# Patient Record
Sex: Male | Born: 1935 | Hispanic: No | Marital: Single | Smoking: Former smoker
Health system: Southern US, Community
[De-identification: ages and names within clinical notes are randomized; demographics above are authoritative.]

## PROBLEM LIST (undated history)

## (undated) DIAGNOSIS — R972 Elevated prostate specific antigen [PSA]: Secondary | ICD-10-CM

## (undated) DIAGNOSIS — I739 Peripheral vascular disease, unspecified: Secondary | ICD-10-CM

## (undated) DIAGNOSIS — M25569 Pain in unspecified knee: Secondary | ICD-10-CM

## (undated) DIAGNOSIS — K219 Gastro-esophageal reflux disease without esophagitis: Secondary | ICD-10-CM

## (undated) DIAGNOSIS — M1990 Unspecified osteoarthritis, unspecified site: Secondary | ICD-10-CM

## (undated) DIAGNOSIS — E785 Hyperlipidemia, unspecified: Secondary | ICD-10-CM

## (undated) HISTORY — DX: Gastro-esophageal reflux disease without esophagitis: K21.9

## (undated) HISTORY — PX: FOOT FRACTURE SURGERY: SHX645

## (undated) HISTORY — DX: Pain in unspecified knee: M25.569

## (undated) HISTORY — PX: HERNIA REPAIR: SHX51

## (undated) HISTORY — PX: OTHER SURGICAL HISTORY: SHX169

## (undated) HISTORY — DX: Hyperlipidemia, unspecified: E78.5

## (undated) HISTORY — DX: Unspecified osteoarthritis, unspecified site: M19.90

## (undated) HISTORY — DX: Peripheral vascular disease, unspecified: I73.9

## (undated) HISTORY — DX: Elevated prostate specific antigen (PSA): R97.20

---

## 1989-07-31 HISTORY — PX: OTHER SURGICAL HISTORY: SHX169

## 1997-06-30 HISTORY — PX: VAGOTOMY: SHX5282

## 2004-06-22 ENCOUNTER — Ambulatory Visit: Payer: Self-pay | Admitting: Internal Medicine

## 2004-06-30 ENCOUNTER — Ambulatory Visit: Payer: Self-pay

## 2004-08-22 ENCOUNTER — Ambulatory Visit: Payer: Self-pay | Admitting: Internal Medicine

## 2004-08-23 ENCOUNTER — Ambulatory Visit: Payer: Self-pay | Admitting: Internal Medicine

## 2004-10-20 ENCOUNTER — Ambulatory Visit: Payer: Self-pay | Admitting: Internal Medicine

## 2005-03-06 ENCOUNTER — Ambulatory Visit: Payer: Self-pay | Admitting: Internal Medicine

## 2005-03-13 ENCOUNTER — Ambulatory Visit: Payer: Self-pay | Admitting: Internal Medicine

## 2005-06-06 ENCOUNTER — Ambulatory Visit: Payer: Self-pay | Admitting: Internal Medicine

## 2005-06-13 ENCOUNTER — Ambulatory Visit: Payer: Self-pay | Admitting: Internal Medicine

## 2005-09-11 ENCOUNTER — Ambulatory Visit: Payer: Self-pay | Admitting: Internal Medicine

## 2005-09-19 ENCOUNTER — Ambulatory Visit: Payer: Self-pay | Admitting: Internal Medicine

## 2005-11-09 ENCOUNTER — Emergency Department (HOSPITAL_COMMUNITY): Admission: EM | Admit: 2005-11-09 | Discharge: 2005-11-09 | Payer: Self-pay | Admitting: Emergency Medicine

## 2005-11-20 ENCOUNTER — Ambulatory Visit: Payer: Self-pay | Admitting: Internal Medicine

## 2005-11-27 ENCOUNTER — Ambulatory Visit: Payer: Self-pay | Admitting: Internal Medicine

## 2006-02-06 ENCOUNTER — Ambulatory Visit: Payer: Self-pay | Admitting: Internal Medicine

## 2006-02-20 ENCOUNTER — Ambulatory Visit: Payer: Self-pay | Admitting: Internal Medicine

## 2006-05-21 ENCOUNTER — Ambulatory Visit: Payer: Self-pay | Admitting: Internal Medicine

## 2006-05-21 LAB — CONVERTED CEMR LAB
ALT: 26 units/L (ref 0–40)
AST: 26 units/L (ref 0–37)
Albumin: 3.5 g/dL (ref 3.5–5.2)
Alkaline Phosphatase: 53 units/L (ref 39–117)
BUN: 18 mg/dL (ref 6–23)
Basophils Absolute: 0 10*3/uL (ref 0.0–0.1)
Basophils Relative: 0.4 % (ref 0.0–1.0)
Bilirubin Urine: NEGATIVE
CO2: 31 meq/L (ref 19–32)
Calcium: 9.3 mg/dL (ref 8.4–10.5)
Chloride: 106 meq/L (ref 96–112)
Chol/HDL Ratio, serum: 4.2
Cholesterol: 248 mg/dL (ref 0–200)
Creatinine, Ser: 1 mg/dL (ref 0.4–1.5)
Eosinophil percent: 1.1 % (ref 0.0–5.0)
GFR calc non Af Amer: 79 mL/min
Glomerular Filtration Rate, Af Am: 95 mL/min/{1.73_m2}
Glucose, Bld: 92 mg/dL (ref 70–99)
HCT: 44.1 % (ref 39.0–52.0)
HDL: 58.4 mg/dL (ref >39.0)
Hemoglobin, Urine: NEGATIVE
Hemoglobin: 14.5 g/dL (ref 13.0–17.0)
Ketones, ur: NEGATIVE mg/dL
LDL DIRECT: 168.9 mg/dL
Leukocytes, UA: NEGATIVE
Lymphocytes Relative: 22.8 % (ref 12.0–46.0)
MCHC: 32.9 g/dL (ref 30.0–36.0)
MCV: 92.8 fL (ref 78.0–100.0)
Monocytes Absolute: 0.5 10*3/uL (ref 0.2–0.7)
Monocytes Relative: 9.2 % (ref 3.0–11.0)
Neutro Abs: 4 10*3/uL (ref 1.4–7.7)
Neutrophils Relative %: 66.5 % (ref 43.0–77.0)
Nitrite: NEGATIVE
PSA: 5.01 ng/mL — ABNORMAL HIGH (ref 0.10–4.00)
Platelets: 172 10*3/uL (ref 150–400)
Potassium: 4.3 meq/L (ref 3.5–5.1)
RBC: 4.75 M/uL (ref 4.22–5.81)
RDW: 14.4 % (ref 11.5–14.6)
Sodium: 141 meq/L (ref 135–145)
Specific Gravity, Urine: 1.015 (ref 1.000–1.03)
TSH: 1.67 microintl units/mL (ref 0.35–5.50)
Total Bilirubin: 0.7 mg/dL (ref 0.3–1.2)
Total Protein, Urine: NEGATIVE mg/dL
Total Protein: 6.7 g/dL (ref 6.0–8.3)
Triglyceride fasting, serum: 69 mg/dL (ref 0–149)
Urine Glucose: NEGATIVE mg/dL
Urobilinogen, UA: 0.2 (ref 0.0–1.0)
VLDL: 14 mg/dL (ref 0–40)
WBC: 5.9 10*3/uL (ref 4.5–10.5)
pH: 6 (ref 5.0–8.0)

## 2006-05-29 ENCOUNTER — Ambulatory Visit: Payer: Self-pay | Admitting: Internal Medicine

## 2006-07-31 DIAGNOSIS — R972 Elevated prostate specific antigen [PSA]: Secondary | ICD-10-CM

## 2006-07-31 HISTORY — DX: Elevated prostate specific antigen (PSA): R97.20

## 2006-09-11 ENCOUNTER — Ambulatory Visit: Payer: Self-pay | Admitting: Internal Medicine

## 2006-09-11 LAB — CONVERTED CEMR LAB
ALT: 35 units/L (ref 0–40)
AST: 28 units/L (ref 0–37)
Cholesterol: 260 mg/dL (ref 0–200)
Direct LDL: 171.2 mg/dL
HDL: 59 mg/dL (ref >39.0)
PSA: 5.95 ng/mL — ABNORMAL HIGH (ref 0.10–4.00)
Sed Rate: 20 mm/hr (ref 0–20)
Total CHOL/HDL Ratio: 4.4
Triglycerides: 82 mg/dL (ref 0–149)
VLDL: 16 mg/dL (ref 0–40)

## 2006-09-18 ENCOUNTER — Ambulatory Visit: Payer: Self-pay | Admitting: Internal Medicine

## 2006-12-11 ENCOUNTER — Ambulatory Visit: Payer: Self-pay | Admitting: Internal Medicine

## 2006-12-11 LAB — CONVERTED CEMR LAB
ALT: 30 units/L (ref 0–40)
AST: 31 units/L (ref 0–37)
Cholesterol: 222 mg/dL (ref 0–200)
Direct LDL: 145.6 mg/dL
Glucose, Bld: 86 mg/dL (ref 70–99)
HDL: 52.1 mg/dL (ref >39.0)
PSA: 5.53 ng/mL — ABNORMAL HIGH (ref 0.10–4.00)
Total CHOL/HDL Ratio: 4.3
Triglycerides: 52 mg/dL (ref 0–149)
VLDL: 10 mg/dL (ref 0–40)

## 2006-12-12 ENCOUNTER — Encounter: Payer: Self-pay | Admitting: Internal Medicine

## 2006-12-12 LAB — CONVERTED CEMR LAB
PSA, Free Pct: 20 — ABNORMAL LOW (ref >25)
PSA, Free: 1.2 ng/mL
PSA: 5.89 ng/mL — ABNORMAL HIGH (ref 0.10–4.00)

## 2006-12-19 ENCOUNTER — Ambulatory Visit: Payer: Self-pay | Admitting: Internal Medicine

## 2007-02-23 ENCOUNTER — Encounter: Payer: Self-pay | Admitting: Internal Medicine

## 2007-02-23 DIAGNOSIS — R972 Elevated prostate specific antigen [PSA]: Secondary | ICD-10-CM | POA: Insufficient documentation

## 2007-02-23 DIAGNOSIS — I872 Venous insufficiency (chronic) (peripheral): Secondary | ICD-10-CM | POA: Insufficient documentation

## 2007-02-23 DIAGNOSIS — M1990 Unspecified osteoarthritis, unspecified site: Secondary | ICD-10-CM | POA: Insufficient documentation

## 2007-02-23 DIAGNOSIS — M4120 Other idiopathic scoliosis, site unspecified: Secondary | ICD-10-CM | POA: Insufficient documentation

## 2007-02-23 DIAGNOSIS — K649 Unspecified hemorrhoids: Secondary | ICD-10-CM | POA: Insufficient documentation

## 2007-04-02 ENCOUNTER — Ambulatory Visit: Payer: Self-pay | Admitting: Internal Medicine

## 2007-04-02 LAB — CONVERTED CEMR LAB
ALT: 33 units/L (ref 0–53)
AST: 35 units/L (ref 0–37)
Cholesterol: 218 mg/dL (ref 0–200)
Direct LDL: 145.3 mg/dL
HDL: 55.6 mg/dL (ref >39.0)
PSA: 5.81 ng/mL — ABNORMAL HIGH (ref 0.10–4.00)
Total CHOL/HDL Ratio: 3.9
Triglycerides: 73 mg/dL (ref 0–149)
VLDL: 15 mg/dL (ref 0–40)
Vit D, 1,25-Dihydroxy: 30 (ref 20–57)

## 2007-04-10 ENCOUNTER — Ambulatory Visit: Payer: Self-pay | Admitting: Internal Medicine

## 2007-06-25 ENCOUNTER — Encounter: Payer: Self-pay | Admitting: Internal Medicine

## 2007-07-10 ENCOUNTER — Ambulatory Visit: Payer: Self-pay | Admitting: Internal Medicine

## 2007-07-10 DIAGNOSIS — E785 Hyperlipidemia, unspecified: Secondary | ICD-10-CM | POA: Insufficient documentation

## 2007-07-10 LAB — CONVERTED CEMR LAB
ALT: 29 units/L (ref 0–53)
AST: 31 units/L (ref 0–37)
Albumin: 3.2 g/dL — ABNORMAL LOW (ref 3.5–5.2)
Alkaline Phosphatase: 47 units/L (ref 39–117)
BUN: 19 mg/dL (ref 6–23)
Bilirubin, Direct: 0.1 mg/dL (ref 0.0–0.3)
CO2: 31 meq/L (ref 19–32)
Calcium: 9.5 mg/dL (ref 8.4–10.5)
Chloride: 108 meq/L (ref 96–112)
Creatinine, Ser: 1.1 mg/dL (ref 0.4–1.5)
GFR calc Af Amer: 85 mL/min
GFR calc non Af Amer: 70 mL/min
Glucose, Bld: 103 mg/dL — ABNORMAL HIGH (ref 70–99)
PSA: 5.24 ng/mL — ABNORMAL HIGH (ref 0.10–4.00)
Potassium: 4.7 meq/L (ref 3.5–5.1)
Sodium: 144 meq/L (ref 135–145)
TSH: 2.15 microintl units/mL (ref 0.35–5.50)
Total Bilirubin: 1.1 mg/dL (ref 0.3–1.2)
Total Protein: 6.6 g/dL (ref 6.0–8.3)

## 2007-07-14 ENCOUNTER — Encounter: Payer: Self-pay | Admitting: Internal Medicine

## 2007-10-17 ENCOUNTER — Ambulatory Visit: Payer: Self-pay | Admitting: Internal Medicine

## 2007-10-20 LAB — CONVERTED CEMR LAB
ALT: 31 units/L (ref 0–53)
AST: 31 units/L (ref 0–37)
Cholesterol: 233 mg/dL (ref 0–200)
Direct LDL: 153.2 mg/dL
HDL: 59.4 mg/dL (ref >39.0)
PSA: 5.55 ng/mL — ABNORMAL HIGH (ref 0.10–4.00)
Total CHOL/HDL Ratio: 3.9
Triglycerides: 74 mg/dL (ref 0–149)
VLDL: 15 mg/dL (ref 0–40)

## 2007-10-24 ENCOUNTER — Ambulatory Visit: Payer: Self-pay | Admitting: Internal Medicine

## 2007-10-31 ENCOUNTER — Encounter: Payer: Self-pay | Admitting: Internal Medicine

## 2007-12-06 ENCOUNTER — Encounter: Payer: Self-pay | Admitting: Internal Medicine

## 2007-12-31 ENCOUNTER — Ambulatory Visit: Payer: Self-pay | Admitting: Internal Medicine

## 2007-12-31 DIAGNOSIS — R21 Rash and other nonspecific skin eruption: Secondary | ICD-10-CM

## 2008-01-01 ENCOUNTER — Telehealth: Payer: Self-pay | Admitting: Internal Medicine

## 2008-01-01 ENCOUNTER — Encounter: Payer: Self-pay | Admitting: Internal Medicine

## 2008-01-20 ENCOUNTER — Encounter: Payer: Self-pay | Admitting: Internal Medicine

## 2008-02-12 ENCOUNTER — Ambulatory Visit: Payer: Self-pay | Admitting: Internal Medicine

## 2008-02-12 LAB — CONVERTED CEMR LAB
ALT: 37 units/L (ref 0–53)
AST: 40 units/L — ABNORMAL HIGH (ref 0–37)
Albumin: 3.6 g/dL (ref 3.5–5.2)
Alkaline Phosphatase: 54 units/L (ref 39–117)
BUN: 20 mg/dL (ref 6–23)
Bilirubin, Direct: 0.1 mg/dL (ref 0.0–0.3)
CO2: 32 meq/L (ref 19–32)
Calcium: 9.5 mg/dL (ref 8.4–10.5)
Chloride: 107 meq/L (ref 96–112)
Cholesterol: 186 mg/dL (ref 0–200)
Creatinine, Ser: 0.9 mg/dL (ref 0.4–1.5)
GFR calc Af Amer: 107 mL/min
GFR calc non Af Amer: 88 mL/min
Glucose, Bld: 98 mg/dL (ref 70–99)
HDL: 55 mg/dL (ref >39.0)
Hgb A1c MFr Bld: 5.8 % (ref 4.6–6.0)
LDL Cholesterol: 118 mg/dL — ABNORMAL HIGH (ref 0–99)
Potassium: 4.5 meq/L (ref 3.5–5.1)
Sodium: 142 meq/L (ref 135–145)
Total Bilirubin: 1 mg/dL (ref 0.3–1.2)
Total CHOL/HDL Ratio: 3.4
Total Protein: 6.7 g/dL (ref 6.0–8.3)
Triglycerides: 65 mg/dL (ref 0–149)
VLDL: 13 mg/dL (ref 0–40)

## 2008-02-19 ENCOUNTER — Ambulatory Visit: Payer: Self-pay | Admitting: Internal Medicine

## 2008-03-30 ENCOUNTER — Encounter: Payer: Self-pay | Admitting: Internal Medicine

## 2008-03-30 ENCOUNTER — Telehealth: Payer: Self-pay | Admitting: Internal Medicine

## 2008-04-27 ENCOUNTER — Encounter: Payer: Self-pay | Admitting: Internal Medicine

## 2008-06-24 ENCOUNTER — Ambulatory Visit: Payer: Self-pay | Admitting: Internal Medicine

## 2008-06-24 LAB — CONVERTED CEMR LAB
ALT: 30 units/L (ref 0–53)
AST: 34 units/L (ref 0–37)
Albumin: 3.8 g/dL (ref 3.5–5.2)
Alkaline Phosphatase: 55 units/L (ref 39–117)
BUN: 23 mg/dL (ref 6–23)
Bilirubin, Direct: 0.2 mg/dL (ref 0.0–0.3)
CO2: 30 meq/L (ref 19–32)
Calcium: 9.4 mg/dL (ref 8.4–10.5)
Chloride: 107 meq/L (ref 96–112)
Cholesterol: 208 mg/dL (ref 0–200)
Creatinine, Ser: 0.9 mg/dL (ref 0.4–1.5)
Direct LDL: 110.6 mg/dL
GFR calc Af Amer: 107 mL/min
GFR calc non Af Amer: 88 mL/min
Glucose, Bld: 93 mg/dL (ref 70–99)
HDL: 68 mg/dL (ref >39.0)
PSA: 6.86 ng/mL — ABNORMAL HIGH (ref 0.10–4.00)
Potassium: 4.2 meq/L (ref 3.5–5.1)
Sodium: 142 meq/L (ref 135–145)
TSH: 1.3 microintl units/mL (ref 0.35–5.50)
Total Bilirubin: 1.1 mg/dL (ref 0.3–1.2)
Total CHOL/HDL Ratio: 3.1
Total Protein: 7.3 g/dL (ref 6.0–8.3)
Triglycerides: 62 mg/dL (ref 0–149)
VLDL: 12 mg/dL (ref 0–40)

## 2008-07-31 HISTORY — PX: OTHER SURGICAL HISTORY: SHX169

## 2008-10-22 ENCOUNTER — Ambulatory Visit: Payer: Self-pay | Admitting: Internal Medicine

## 2008-10-22 LAB — CONVERTED CEMR LAB
ALT: 25 units/L (ref 0–53)
AST: 29 units/L (ref 0–37)
Albumin: 3.7 g/dL (ref 3.5–5.2)
Alkaline Phosphatase: 53 units/L (ref 39–117)
BUN: 26 mg/dL — ABNORMAL HIGH (ref 6–23)
Bilirubin, Direct: 0.1 mg/dL (ref 0.0–0.3)
CO2: 28 meq/L (ref 19–32)
Calcium: 9.5 mg/dL (ref 8.4–10.5)
Chloride: 105 meq/L (ref 96–112)
Cholesterol: 198 mg/dL (ref 0–200)
Creatinine, Ser: 0.9 mg/dL (ref 0.4–1.5)
GFR calc non Af Amer: 87.97 mL/min (ref >60)
Glucose, Bld: 94 mg/dL (ref 70–99)
HDL: 61.9 mg/dL (ref >39.00)
LDL Cholesterol: 122 mg/dL — ABNORMAL HIGH (ref 0–99)
PSA: 5.84 ng/mL — ABNORMAL HIGH (ref 0.10–4.00)
Potassium: 4.1 meq/L (ref 3.5–5.1)
Sodium: 139 meq/L (ref 135–145)
Total Bilirubin: 0.9 mg/dL (ref 0.3–1.2)
Total CHOL/HDL Ratio: 3
Total Protein: 6.8 g/dL (ref 6.0–8.3)
Triglycerides: 71 mg/dL (ref 0.0–149.0)
VLDL: 14.2 mg/dL (ref 0.0–40.0)

## 2008-11-24 ENCOUNTER — Telehealth: Payer: Self-pay | Admitting: Internal Medicine

## 2009-02-19 ENCOUNTER — Ambulatory Visit: Payer: Self-pay | Admitting: Internal Medicine

## 2009-02-19 DIAGNOSIS — M25569 Pain in unspecified knee: Secondary | ICD-10-CM

## 2009-02-19 LAB — CONVERTED CEMR LAB
ALT: 26 units/L (ref 0–53)
AST: 35 units/L (ref 0–37)
Albumin: 3.6 g/dL (ref 3.5–5.2)
Alkaline Phosphatase: 54 units/L (ref 39–117)
BUN: 23 mg/dL (ref 6–23)
Basophils Absolute: 0 10*3/uL (ref 0.0–0.1)
Basophils Relative: 0.3 % (ref 0.0–3.0)
Bilirubin, Direct: 0.2 mg/dL (ref 0.0–0.3)
CO2: 30 meq/L (ref 19–32)
Calcium: 9.2 mg/dL (ref 8.4–10.5)
Chloride: 107 meq/L (ref 96–112)
Cholesterol: 218 mg/dL — ABNORMAL HIGH (ref 0–200)
Creatinine, Ser: 0.9 mg/dL (ref 0.4–1.5)
Direct LDL: 131.8 mg/dL
Eosinophils Absolute: 0.1 10*3/uL (ref 0.0–0.7)
Eosinophils Relative: 1 % (ref 0.0–5.0)
GFR calc non Af Amer: 87.89 mL/min (ref >60)
Glucose, Bld: 88 mg/dL (ref 70–99)
HCT: 39.6 % (ref 39.0–52.0)
HDL: 65.4 mg/dL (ref >39.00)
Hemoglobin: 13.6 g/dL (ref 13.0–17.0)
Lymphocytes Relative: 19.2 % (ref 12.0–46.0)
Lymphs Abs: 1.1 10*3/uL (ref 0.7–4.0)
MCHC: 34.3 g/dL (ref 30.0–36.0)
MCV: 94.2 fL (ref 78.0–100.0)
Monocytes Absolute: 0.5 10*3/uL (ref 0.1–1.0)
Monocytes Relative: 9.3 % (ref 3.0–12.0)
Neutro Abs: 3.8 10*3/uL (ref 1.4–7.7)
Neutrophils Relative %: 70.2 % (ref 43.0–77.0)
Platelets: 131 10*3/uL — ABNORMAL LOW (ref 150.0–400.0)
Potassium: 4.3 meq/L (ref 3.5–5.1)
RBC: 4.2 M/uL — ABNORMAL LOW (ref 4.22–5.81)
RDW: 12.9 % (ref 11.5–14.6)
Sodium: 140 meq/L (ref 135–145)
TSH: 1.56 microintl units/mL (ref 0.35–5.50)
Total Bilirubin: 1.2 mg/dL (ref 0.3–1.2)
Total CHOL/HDL Ratio: 3
Total Protein: 6.8 g/dL (ref 6.0–8.3)
Triglycerides: 52 mg/dL (ref 0.0–149.0)
VLDL: 10.4 mg/dL (ref 0.0–40.0)
WBC: 5.5 10*3/uL (ref 4.5–10.5)

## 2009-02-22 ENCOUNTER — Encounter: Payer: Self-pay | Admitting: Internal Medicine

## 2009-02-25 LAB — CONVERTED CEMR LAB: PSA: 6.54 ng/mL — ABNORMAL HIGH (ref 0.10–4.00)

## 2009-06-23 ENCOUNTER — Ambulatory Visit: Payer: Self-pay | Admitting: Internal Medicine

## 2009-06-23 DIAGNOSIS — Z87891 Personal history of nicotine dependence: Secondary | ICD-10-CM

## 2009-06-23 LAB — CONVERTED CEMR LAB
ALT: 26 units/L (ref 0–53)
Basophils Absolute: 0 10*3/uL (ref 0.0–0.1)
CO2: 30 meq/L (ref 19–32)
Calcium: 9.4 mg/dL (ref 8.4–10.5)
Chloride: 106 meq/L (ref 96–112)
Cholesterol: 212 mg/dL — ABNORMAL HIGH (ref 0–200)
Creatinine, Ser: 0.9 mg/dL (ref 0.4–1.5)
Eosinophils Relative: 0.9 % (ref 0.0–5.0)
GFR calc non Af Amer: 87.81 mL/min (ref >60)
Glucose, Bld: 85 mg/dL (ref 70–99)
HCT: 39.9 % (ref 39.0–52.0)
Hemoglobin: 13.7 g/dL (ref 13.0–17.0)
Lymphocytes Relative: 19.7 % (ref 12.0–46.0)
Monocytes Relative: 9.2 % (ref 3.0–12.0)
Neutro Abs: 4.3 10*3/uL (ref 1.4–7.7)
RBC: 4.16 M/uL — ABNORMAL LOW (ref 4.22–5.81)
RDW: 12.6 % (ref 11.5–14.6)
Sodium: 140 meq/L (ref 135–145)
TSH: 1.67 microintl units/mL (ref 0.35–5.50)
Total CHOL/HDL Ratio: 3
Total Protein: 6.7 g/dL (ref 6.0–8.3)
VLDL: 13.6 mg/dL (ref 0.0–40.0)
WBC: 6.2 10*3/uL (ref 4.5–10.5)

## 2009-10-20 ENCOUNTER — Ambulatory Visit: Payer: Self-pay | Admitting: Internal Medicine

## 2010-01-06 ENCOUNTER — Encounter: Payer: Self-pay | Admitting: Internal Medicine

## 2010-02-16 ENCOUNTER — Telehealth: Payer: Self-pay | Admitting: Internal Medicine

## 2010-02-25 ENCOUNTER — Ambulatory Visit: Payer: Self-pay | Admitting: Internal Medicine

## 2010-02-25 DIAGNOSIS — R209 Unspecified disturbances of skin sensation: Secondary | ICD-10-CM

## 2010-02-25 DIAGNOSIS — R238 Other skin changes: Secondary | ICD-10-CM

## 2010-02-25 LAB — CONVERTED CEMR LAB
Albumin: 4 g/dL (ref 3.5–5.2)
BUN: 15 mg/dL (ref 6–23)
Calcium: 9.2 mg/dL (ref 8.4–10.5)
Cholesterol: 181 mg/dL (ref 0–200)
GFR calc non Af Amer: 115.23 mL/min (ref >60)
Glucose, Bld: 74 mg/dL (ref 70–99)
HDL: 75.8 mg/dL (ref >39.00)
Triglycerides: 65 mg/dL (ref 0.0–149.0)
VLDL: 13 mg/dL (ref 0.0–40.0)

## 2010-03-24 ENCOUNTER — Telehealth: Payer: Self-pay | Admitting: Internal Medicine

## 2010-04-07 ENCOUNTER — Encounter: Payer: Self-pay | Admitting: Internal Medicine

## 2010-05-27 ENCOUNTER — Ambulatory Visit: Payer: Self-pay | Admitting: Internal Medicine

## 2010-05-27 DIAGNOSIS — R609 Edema, unspecified: Secondary | ICD-10-CM | POA: Insufficient documentation

## 2010-05-27 LAB — CONVERTED CEMR LAB
BUN: 11 mg/dL (ref 6–23)
CO2: 30 meq/L (ref 19–32)
Chloride: 105 meq/L (ref 96–112)
Glucose, Bld: 79 mg/dL (ref 70–99)
Potassium: 5.1 meq/L (ref 3.5–5.1)

## 2010-06-02 ENCOUNTER — Telehealth: Payer: Self-pay | Admitting: Internal Medicine

## 2010-06-09 ENCOUNTER — Encounter: Payer: Self-pay | Admitting: Internal Medicine

## 2010-06-22 ENCOUNTER — Encounter: Payer: Self-pay | Admitting: Internal Medicine

## 2010-07-31 HISTORY — PX: JOINT REPLACEMENT: SHX530

## 2010-08-12 ENCOUNTER — Ambulatory Visit
Admission: RE | Admit: 2010-08-12 | Discharge: 2010-08-12 | Payer: Self-pay | Source: Home / Self Care | Attending: Internal Medicine | Admitting: Internal Medicine

## 2010-08-12 DIAGNOSIS — L290 Pruritus ani: Secondary | ICD-10-CM | POA: Insufficient documentation

## 2010-08-17 LAB — COMPREHENSIVE METABOLIC PANEL
ALT: 35 U/L (ref 0–53)
AST: 41 U/L — ABNORMAL HIGH (ref 0–37)
Albumin: 3.7 g/dL (ref 3.5–5.2)
Alkaline Phosphatase: 63 U/L (ref 39–117)
BUN: 8 mg/dL (ref 6–23)
CO2: 28 mEq/L (ref 19–32)
Calcium: 9.5 mg/dL (ref 8.4–10.5)
Chloride: 104 mEq/L (ref 96–112)
Creatinine, Ser: 0.98 mg/dL (ref 0.4–1.5)
GFR calc Af Amer: >60 mL/min (ref >60)
GFR calc non Af Amer: >60 mL/min (ref >60)
Glucose, Bld: 90 mg/dL (ref 70–99)
Potassium: 4.3 mEq/L (ref 3.5–5.1)
Sodium: 139 mEq/L (ref 135–145)
Total Bilirubin: 0.7 mg/dL (ref 0.3–1.2)
Total Protein: 6.4 g/dL (ref 6.0–8.3)

## 2010-08-17 LAB — CBC
HCT: 38.6 % — ABNORMAL LOW (ref 39.0–52.0)
Hemoglobin: 12.9 g/dL — ABNORMAL LOW (ref 13.0–17.0)
MCH: 30.6 pg (ref 26.0–34.0)
MCHC: 33.4 g/dL (ref 30.0–36.0)
MCV: 91.5 fL (ref 78.0–100.0)
Platelets: 161 10*3/uL (ref 150–400)
RBC: 4.22 MIL/uL (ref 4.22–5.81)
RDW: 13.6 % (ref 11.5–15.5)
WBC: 6.2 10*3/uL (ref 4.0–10.5)

## 2010-08-17 LAB — PROTIME-INR
INR: 1.14 (ref 0.00–1.49)
Prothrombin Time: 14.8 seconds (ref 11.6–15.2)

## 2010-08-17 LAB — DIFFERENTIAL
Basophils Absolute: 0 10*3/uL (ref 0.0–0.1)
Basophils Relative: 0 % (ref 0–1)
Eosinophils Absolute: 0.1 10*3/uL (ref 0.0–0.7)
Eosinophils Relative: 2 % (ref 0–5)
Lymphocytes Relative: 19 % (ref 12–46)
Lymphs Abs: 1.2 10*3/uL (ref 0.7–4.0)
Monocytes Absolute: 0.6 10*3/uL (ref 0.1–1.0)
Monocytes Relative: 10 % (ref 3–12)
Neutro Abs: 4.3 10*3/uL (ref 1.7–7.7)
Neutrophils Relative %: 69 % (ref 43–77)

## 2010-08-17 LAB — SURGICAL PCR SCREEN
MRSA, PCR: NEGATIVE
Staphylococcus aureus: NEGATIVE

## 2010-08-17 LAB — URINALYSIS, ROUTINE W REFLEX MICROSCOPIC
Bilirubin Urine: NEGATIVE
Hgb urine dipstick: NEGATIVE
Ketones, ur: NEGATIVE mg/dL
Nitrite: NEGATIVE
Protein, ur: NEGATIVE mg/dL
Specific Gravity, Urine: 1.02 (ref 1.005–1.030)
Urine Glucose, Fasting: NEGATIVE mg/dL
Urobilinogen, UA: 1 mg/dL (ref 0.0–1.0)
pH: 7 (ref 5.0–8.0)

## 2010-08-17 LAB — APTT: aPTT: 45 seconds — ABNORMAL HIGH (ref 24–37)

## 2010-08-22 ENCOUNTER — Inpatient Hospital Stay (HOSPITAL_COMMUNITY)
Admission: RE | Admit: 2010-08-22 | Discharge: 2010-08-25 | Payer: Self-pay | Source: Home / Self Care | Attending: Orthopedic Surgery | Admitting: Orthopedic Surgery

## 2010-08-22 LAB — URINE CULTURE
Colony Count: 3000
Culture  Setup Time: 201201171510

## 2010-08-23 LAB — TYPE AND SCREEN
ABO/RH(D): A POS
Antibody Screen: NEGATIVE

## 2010-08-23 LAB — APTT: aPTT: 37 seconds (ref 24–37)

## 2010-08-23 LAB — ABO/RH: ABO/RH(D): A POS

## 2010-08-24 LAB — BASIC METABOLIC PANEL
BUN: 6 mg/dL (ref 6–23)
BUN: 3 mg/dL — ABNORMAL LOW (ref 6–23)
CO2: 24 mEq/L (ref 19–32)
CO2: 26 mEq/L (ref 19–32)
Calcium: 8.6 mg/dL (ref 8.4–10.5)
Calcium: 8.8 mg/dL (ref 8.4–10.5)
Chloride: 100 mEq/L (ref 96–112)
Chloride: 103 mEq/L (ref 96–112)
Creatinine, Ser: 0.92 mg/dL (ref 0.4–1.5)
Creatinine, Ser: 0.9 mg/dL (ref 0.4–1.5)
GFR calc Af Amer: >60 mL/min (ref >60)
GFR calc Af Amer: >60 mL/min (ref >60)
GFR calc non Af Amer: >60 mL/min (ref >60)
GFR calc non Af Amer: >60 mL/min (ref >60)
Glucose, Bld: 95 mg/dL (ref 70–99)
Glucose, Bld: 84 mg/dL (ref 70–99)
Potassium: 4.4 mEq/L (ref 3.5–5.1)
Potassium: 4.7 mEq/L (ref 3.5–5.1)
Sodium: 135 mEq/L (ref 135–145)
Sodium: 135 mEq/L (ref 135–145)

## 2010-08-24 LAB — CARDIAC PANEL(CRET KIN+CKTOT+MB+TROPI)
CK, MB: 3.8 ng/mL (ref 0.3–4.0)
Relative Index: 3 — ABNORMAL HIGH (ref 0.0–2.5)
Total CK: 125 U/L (ref 7–232)
Troponin I: 0.01 ng/mL (ref 0.00–0.06)

## 2010-08-24 LAB — CBC
HCT: 32 % — ABNORMAL LOW (ref 39.0–52.0)
HCT: 30.1 % — ABNORMAL LOW (ref 39.0–52.0)
Hemoglobin: 10.5 g/dL — ABNORMAL LOW (ref 13.0–17.0)
Hemoglobin: 9.9 g/dL — ABNORMAL LOW (ref 13.0–17.0)
MCH: 29.9 pg (ref 26.0–34.0)
MCH: 30.2 pg (ref 26.0–34.0)
MCHC: 32.8 g/dL (ref 30.0–36.0)
MCHC: 32.9 g/dL (ref 30.0–36.0)
MCV: 91.2 fL (ref 78.0–100.0)
MCV: 91.8 fL (ref 78.0–100.0)
Platelets: 166 10*3/uL (ref 150–400)
Platelets: 150 10*3/uL (ref 150–400)
RBC: 3.51 MIL/uL — ABNORMAL LOW (ref 4.22–5.81)
RBC: 3.28 MIL/uL — ABNORMAL LOW (ref 4.22–5.81)
RDW: 13.8 % (ref 11.5–15.5)
RDW: 13.8 % (ref 11.5–15.5)
WBC: 10.2 10*3/uL (ref 4.0–10.5)
WBC: 11.8 10*3/uL — ABNORMAL HIGH (ref 4.0–10.5)

## 2010-08-24 LAB — D-DIMER, QUANTITATIVE: D-Dimer, Quant: 3.43 ug/mL-FEU — ABNORMAL HIGH (ref 0.00–0.48)

## 2010-08-25 ENCOUNTER — Encounter: Payer: Self-pay | Admitting: Internal Medicine

## 2010-08-25 LAB — CBC
HCT: 25.2 % — ABNORMAL LOW (ref 39.0–52.0)
Hemoglobin: 8.5 g/dL — ABNORMAL LOW (ref 13.0–17.0)
MCH: 30.4 pg (ref 26.0–34.0)
MCHC: 33.7 g/dL (ref 30.0–36.0)
MCV: 90 fL (ref 78.0–100.0)
Platelets: 146 10*3/uL — ABNORMAL LOW (ref 150–400)
RBC: 2.8 MIL/uL — ABNORMAL LOW (ref 4.22–5.81)
RDW: 13.6 % (ref 11.5–15.5)
WBC: 8.3 10*3/uL (ref 4.0–10.5)

## 2010-08-25 LAB — BASIC METABOLIC PANEL
BUN: 8 mg/dL (ref 6–23)
CO2: 28 mEq/L (ref 19–32)
Calcium: 8.5 mg/dL (ref 8.4–10.5)
Chloride: 104 mEq/L (ref 96–112)
Creatinine, Ser: 1 mg/dL (ref 0.4–1.5)
GFR calc Af Amer: >60 mL/min (ref >60)
GFR calc non Af Amer: >60 mL/min (ref >60)
Glucose, Bld: 97 mg/dL (ref 70–99)
Potassium: 4 mEq/L (ref 3.5–5.1)
Sodium: 137 mEq/L (ref 135–145)

## 2010-08-31 NOTE — Progress Notes (Signed)
Summary: Ketoconazole  Phone Note Call from Patient    Follow-up for Phone Call        pt came in office today stating he needs ketoconazole 2% cream # 120 gm.  I called [facility] and spoke to pharmacist who informed me pt just p/u 90gm on 05-22-10 and paid 1.60.  She states we can send in new rx for pt's next fill. Follow-up by: Lanier Prude, [facility]),  June 02, 2010 4:02 PM    Prescriptions: KETOCONAZOLE 2 % CREA (KETOCONAZOLE) use two times a day  #120 x 3   Entered by:   Lanier Prude, [facility])   Authorized by:   Tresa Garter MD   Signed by:   Lanier Prude, [facility]) on 06/02/2010   Method used:   [facility]        [facility] [address] [phone]* (retail)       [address]       [place], [place]  [address]       Ph: [phone] or [phone]       Fax: [phone]   RxID:   [id number] KETOCONAZOLE 2 % CREA (KETOCONAZOLE) use two times a day  #120 x 3   Entered by:   Bill Salinas CMA   Authorized by:   Tresa Garter MD   Signed by:   Bill Salinas CMA on 06/02/2010   Method used:   Print then Give to Patient   RxID:   [id number]

## 2010-08-31 NOTE — Letter (Signed)
Summary: Surgical clearance/[facility]  Surgical clearance/[facility]   Imported By: Lester Sandoval 06/28/2010 11:23:34  _____________________________________________________________________  External Attachment:    Type:   Image     Comment:   External Document

## 2010-08-31 NOTE — Assessment & Plan Note (Signed)
Summary: 4 MO ROV /NWS   Vital Signs:  Patient profile:   77 year old male Height:      64 inches Weight:      163 pounds BMI:     28.08 O2 Sat:      96 % on Room air Temp:     98.1 degrees F oral Pulse rate:   66 / minute Pulse rhythm:   regular Resp:     16 per minute BP sitting:   120 / 70  (left arm) Cuff size:   regular  Vitals Entered By: Lanier Prude, CMA(AAMA) (February 25, 2010 9:56 AM)  O2 Flow:  Room air CC: 4 mo f/u Is Patient Diabetic? No   CC:  4 mo f/u.  History of Present Illness: C/o bruises C/o skin lesion on R elbow The patient presents for a follow up of  hyperlipidemia, OA and vein disease  Current Medications (verified): 1)  Ranitidine Hcl 300 Mg  Caps (Ranitidine Hcl) .Marland Kitchen.. 1 By Mouth Qd 2)  Vitamin D3 1000 Unit  Tabs (Cholecalciferol) .Marland Kitchen.. 1 Qd 3)  Crestor 40 Mg Tabs (Rosuvastatin Calcium) .Marland Kitchen.. 1 Tablet By Mouth Daily 4)  Stool Softener Plus Laxative 8.6-50 Mg  Tabs (Sennosides-Docusate Sodium) .... 2 By Mouth Bid 5)  Meloxicam 15 Mg Tabs (Meloxicam) .... One By Mouth Daily 6)  Ocuvite-Lutein  Caps (Multiple Vitamins-Minerals) .... Once Daily 7)  Tramadol Hcl 50 Mg  Tabs (Tramadol Hcl) .Marland Kitchen.. 1-2 By Mouth Two Times A Day As Needed Pain 8)  Desonide 0.05 % Crea (Desonide) .... Use Bid 9)  Ketoconazole 2 % Crea (Ketoconazole) .... Use Two Times A Day  Allergies (verified): 1)  ! Lipitor  Past History:  Past Medical History: Last updated: 02/19/2009 GERD Osteoarthritis Peripheral vascular disease Hemorrhoids Elev. PSA 2008 Hyperlipidemia XRT exposure in the 80's B knee pain L>R  Past Surgical History: Last updated: 02/23/2007 Vagotomy Nissen fundoplication modification  Social History: Last updated: 07/10/2007 Retire Former Smoker Alcohol use-yes  Regular exercise-yes Single Widower  Review of Systems  The patient denies fever, chest pain, syncope, and abdominal pain.         c/o hemorrhoids  Physical Exam  General:   Well-developed,well-nourished,in no acute distress; alert,appropriate and cooperative throughout examination Ears:  External ear exam shows no significant lesions or deformities.  Otoscopic examination reveals clear canals, tympanic membranes are intact bilaterally without bulging, retraction, inflammation or discharge. Hearing is grossly normal bilaterally. Nose:  External nasal examination shows no deformity or inflammation. Nasal mucosa are pink and moist without lesions or exudates. Mouth:  Oral mucosa and oropharynx without lesions or exudates.  Teeth in good repair. Lungs:  Normal respiratory effort, chest expands symmetrically. Lungs are clear to auscultation, no crackles or wheezes. Heart:  Normal rate and regular rhythm. S1 and S2 normal without gallop, murmur, click, rub or other extra sounds. Abdomen:  Bowel sounds positive,abdomen soft and non-tender without masses, organomegaly or hernias noted. Rectal:  declined Msk:  Thor back with a chronic scoliosis change.  Knees w/OA deformities Skin:  Hyperpigm ankles B elbows with bruises and R with 7 mm papule   Impression & Recommendations:  Problem # 1:  HYPERLIPIDEMIA (ICD-272.4) Assessment Comment Only  His updated medication list for this problem includes:    Crestor 40 Mg Tabs (Rosuvastatin calcium) .Marland Kitchen... 1 tablet by mouth daily  Orders: TLB-BMP (Basic Metabolic Panel-BMET) (80048-METABOL) TLB-Hepatic/Liver Function Pnl (80076-HEPATIC) TLB-Lipid Panel (80061-LIPID) TLB-TSH (Thyroid Stimulating Hormone) (84443-TSH) TLB-PSA (Prostate Specific Antigen) (84153-PSA)  Problem # 2:  RASH AND OTHER NONSPECIFIC SKIN ERUPTION (ICD-782.1) Assessment: Improved  His updated medication list for this problem includes:    Desonide 0.05 % Crea (Desonide) ..... Use bid    Ketoconazole 2 % Crea (Ketoconazole) ..... Use two times a day  Problem # 3:  ECCHYMOSES (ICD-782.9) Assessment: Unchanged  Orders: TLB-BMP (Basic Metabolic  Panel-BMET) (80048-METABOL) TLB-Hepatic/Liver Function Pnl (80076-HEPATIC) TLB-Lipid Panel (80061-LIPID) TLB-TSH (Thyroid Stimulating Hormone) (84443-TSH) TLB-PSA (Prostate Specific Antigen) (84153-PSA)  Problem # 4:  PSA, INCREASED (ICD-790.93) Assessment: Comment Only  Orders: TLB-BMP (Basic Metabolic Panel-BMET) (80048-METABOL) TLB-Hepatic/Liver Function Pnl (80076-HEPATIC) TLB-Lipid Panel (80061-LIPID) TLB-TSH (Thyroid Stimulating Hormone) (84443-TSH) TLB-PSA (Prostate Specific Antigen) (84153-PSA)  Problem # 5:  GERD (ICD-530.81) Assessment: Unchanged  His updated medication list for this problem includes:    Ranitidine Hcl 300 Mg Caps (Ranitidine hcl) .Marland Kitchen... 1 by mouth qd  Problem # 6:  OSTEOARTHRITIS (ICD-715.90) Assessment: Unchanged  His updated medication list for this problem includes:    Meloxicam 15 Mg Tabs (Meloxicam) ..... One by mouth daily    Tramadol Hcl 50 Mg Tabs (Tramadol hcl) .Marland Kitchen... 1-2 by mouth two times a day as needed pain  Orders: TLB-BMP (Basic Metabolic Panel-BMET) (80048-METABOL) TLB-Hepatic/Liver Function Pnl (80076-HEPATIC) TLB-Lipid Panel (80061-LIPID) TLB-TSH (Thyroid Stimulating Hormone) (84443-TSH) TLB-PSA (Prostate Specific Antigen) (84153-PSA)  Problem # 7:  HEMORRHOIDS (ICD-455.6) Assessment: Deteriorated Refused surgical consult See meds  Complete Medication List: 1)  Ranitidine Hcl 300 Mg Caps (Ranitidine hcl) .Marland Kitchen.. 1 by mouth qd 2)  Vitamin D3 1000 Unit Tabs (Cholecalciferol) .Marland Kitchen.. 1 qd 3)  Crestor 40 Mg Tabs (Rosuvastatin calcium) .Marland Kitchen.. 1 tablet by mouth daily 4)  Stool Softener Plus Laxative 8.6-50 Mg Tabs (Sennosides-docusate sodium) .... 2 by mouth bid 5)  Meloxicam 15 Mg Tabs (Meloxicam) .... One by mouth daily 6)  Ocuvite-lutein Caps (Multiple vitamins-minerals) .... Once daily 7)  Tramadol Hcl 50 Mg Tabs (Tramadol hcl) .Marland Kitchen.. 1-2 by mouth two times a day as needed pain 8)  Desonide 0.05 % Crea (Desonide) .... Use bid 9)   Ketoconazole 2 % Crea (Ketoconazole) .... Use two times a day 10)  Anusol-hc 2.5 % Crea (Hydrocortisone) .... Use two times a day for hemorrhoids 11)  Anusol-hc 25 Mg Supp (Hydrocortisone acetate) .Marland Kitchen.. 1 pr two times a day for hemorrhoids  Other Orders: TLB-B12, Serum-Total ONLY ([address]-U04)  Patient Instructions: 1)  Please schedule a follow-up appointment in 3 months. Prescriptions: KETOCONAZOLE 2 % CREA (KETOCONAZOLE) use two times a day  #30g x 3 x 3   Entered and Authorized by:   Tresa Garter MD   Signed by:   Tresa Garter MD on 02/25/2010   Method used:   Print then Give to Patient   RxID:   [id number] TRAMADOL HCL 50 MG  TABS (TRAMADOL HCL) 1-2 by mouth two times a day as needed pain  #120 x 3   Entered and Authorized by:   Tresa Garter MD   Signed by:   Tresa Garter MD on 02/25/2010   Method used:   Print then Give to Patient   RxID:   [id number] ANUSOL-HC 25 MG SUPP (HYDROCORTISONE ACETATE) 1 pr two times a day for hemorrhoids  #20 x 3   Entered and Authorized by:   Tresa Garter MD   Signed by:   Tresa Garter MD on 02/25/2010   Method used:   Print then Give to Patient   RxID:   [id number] ANUSOL-HC 2.5 % CREA (HYDROCORTISONE)  use two times a day for hemorrhoids  #45 g x 3   Entered and Authorized by:   Tresa Garter MD   Signed by:   Tresa Garter MD on 02/25/2010   Method used:   Print then Give to Patient   RxID:   [id number]

## 2010-08-31 NOTE — Op Note (Signed)
NAMEIBRAHIM, Nathaniel Harvey               ACCOUNT NO.:  [id number]  MEDICAL RECORD NO.:  [id number]          PATIENT TYPE:  INP  LOCATION:  5012                         FACILITY:  [facility]  PHYSICIAN:  Keiandra Sullenger A. Thurston Harvey, M.D. DATE OF BIRTH:  03-13-1934  DATE OF PROCEDURE:  08/22/2010 DATE OF DISCHARGE:                              OPERATIVE REPORT   PREOPERATIVE DIAGNOSIS:  Right knee degenerative joint disease.  POSTOPERATIVE DIAGNOSIS:  Right knee degenerative joint disease.  PROCEDURES: 1. Right total knee replacement using DePuy cemented total knee system     with #4 cemented femur, #4 cemented tibia with 15-mm polyethylene     RP tibial spacer and 32-mm polyethylene cemented patella. 2. Zinacef-impregnated cement.  SURGEON:  Nathaniel Alm. Thurston Hole, MD.  ASSISTANTJulien Girt, PA.  ANESTHESIA:  General.  OPERATIVE TIME:  1 hour 25 minutes.  COMPLICATIONS:  None.  DESCRIPTION OF PROCEDURE:  Nathaniel Harvey was brought to the operating room on August 22, 2010, after a femoral nerve block was placed in the holding area by Anesthesia.  He was placed on the operating table in supine position.  He received vancomycin 1 g IV preoperatively for prophylaxis.  After being placed under general anesthesia, had a Foley catheter placed under sterile conditions.  His right knee was examined. Range of motion from -5 to 125 degrees, moderate varus deformity, knee stable on ligamentous exam with normal patellar tracking.  The right leg was then prepped using sterile DuraPrep and draped using sterile technique.  Time-out procedure was called and the correct right leg identified.  The right leg was exsanguinated and thigh tourniquet elevated to 365 mm.  Initially, through a 15-cm longitudinal incision based over the patella, initial exposure was made.  The underlying subcutaneous tissues were incised along with skin incision.  A median arthrotomy was performed revealing an excessive amount of  normal- appearing joint fluid.  The articular surfaces were inspected.  He had grade 4 changes medially, laterally in the patellofemoral joint. Osteophytes removed from the femoral condyles and tibial plateau.  The medial and lateral meniscal remnants removed as well as the anterior cruciate ligament.  Intramedullary drill was then drilled up the femoral canal for placement of distal femoral cutting jig, which was placed in appropriate manner by rotation and a distal 10-mm cut was made.  The distal femur was incised.  A #4 was found be the appropriate size.  A #4 cutting jig was placed in the appropriate manner by external rotation and then these cuts were made.  Proximal tibia was then exposed.  The tibial spines were removed with an oscillating saw.  Intramedullary drill was drilled down the tibial canal for placement of proximal tibial cutting jig which was placed in appropriate manner by rotation and a proximal 2-mm cut was made based off the medial side.  Spacer blocks were then placed in flexion and extension.  15-mm blocks gave excellent balancing, excellent stability, and excellent correction of his flexion and varus deformities.  At this point, the #4 tibial base plate trial was placed on the cut tibial surface with an excellent fit and the keel cut  was made.  The PCL box cutter was then placed on the distal femur and these cuts were made.  At this point, the #4 femoral trial was placed and with a #4 tibial base plate trial and a 15-mm polyethylene RP tibial spacer, knee was reduced, taken through range of motion from 0 to 125 degrees with excellent stability and excellent correction of his flexion and varus deformities and normal patellar tracking.  A resurfacing 9-mm cut was then made on the patella and three locking holes placed for a 32-mm patellar trial and again patellofemoral tracking was evaluated and found to be normal.  At this point, felt that all the trial components  were of excellent size, fit, and stability. They were then removed.  The knee was then jet lavaged, irrigated with 3 liters of saline.  Proximal tibia was then exposed and #4 tibial baseplate with Zinacef-impregnated cement backing was hammered into position with an excellent fit with excess cement being removed from around the edges.  A #4 femoral component with cement backing was hammered into position also with an excellent fit with excess cement being removed from around the edges.  The 15-mm polyethylene RP tibial spacer was placed on tibial baseplate, the knee reduced, taken through range of motion from 0 to 125 degrees with excellent stability and excellent correction of his flexion and varus deformities.  The 32-mm polyethylene cement backed patella was then placed in its position and held there with a clamp.  After the cement hardened, again patellofemoral tracking was evaluated and found to be normal.  At this point, it is felt that all the components were of excellent size and stability.  The wound was further irrigated with saline.  Tourniquet was released.  Hemostasis obtained with cautery.  The arthrotomy was then closed with #1 Ethilon suture over 2 medium Hemovac drains. Subcutaneous tissues closed with 0 0 and 2-0 Vicryl, subcuticular layer closed with 4-0 Monocryl.  Sterile dressings and a long leg splint applied.  The patient then awakened, ,extubated and taken to recovery room in stable condition.  Needle and sponge count was correct x2 at the end the case.  Neurovascular status normal.  Pulses 2+ and symmetric.     Saylee Sherrill A. Thurston Harvey, M.D.     RAW/MEDQ  D:  08/22/2010  T:  08/23/2010  Job:  [id number]  Electronically Signed by Salvatore Marvel M.D. on 08/31/2010 10:43:55 AM

## 2010-08-31 NOTE — Progress Notes (Signed)
Summary: Rf Ketoconazole   Phone Note Refill Request   Ketoconazole 2% cream use two times a day # 90 gram is not on active med list.  ok to RF?   Method Requested: Electronic Next Appointment Scheduled: 02-25-10 Initial call taken by: Lanier Prude, [facility]),  February 16, 2010 11:03 AM  Follow-up for Phone Call        ok 1 ref Follow-up by: Tresa Garter MD,  February 16, 2010 10:47 PM    New/Updated Medications: KETOCONAZOLE 2 % CREA (KETOCONAZOLE) use two times a day Prescriptions: KETOCONAZOLE 2 % CREA (KETOCONAZOLE) use two times a day  #90 x 1   Entered by:   Lanier Prude, CMA(AAMA)   Authorized by:   Tresa Garter MD   Signed by:   Lanier Prude, [facility]) on 02/17/2010   Method used:   [facility]        [facility]  [address] [phone]* (retail)       [address]       [place], [place]  [address]       Ph: [phone] or [phone]       Fax: [phone]   RxID:   [id number]

## 2010-08-31 NOTE — Letter (Signed)
Summary: [facility]  [facility]   Imported By: Lennie Odor 07/05/2010 11:58:13  _____________________________________________________________________  External Attachment:    Type:   Image     Comment:   External Document

## 2010-08-31 NOTE — Letter (Signed)
Summary: [facility]  [facility]   Imported By: Lester Kingsley 02/03/2010 07:59:34  _____________________________________________________________________  External Attachment:    Type:   Image     Comment:   External Document

## 2010-08-31 NOTE — Progress Notes (Signed)
Summary: RF Crestor  Phone Note Call from Patient Call back at Home Phone [phone]   Caller: Patient  ~walkin Call For: Tresa Garter MD Summary of Call: Pt walked in requesting refills of Crestor, pt also requesting refills of the creams he puts skin.  Initial call taken by: Verdell Face,  March 24, 2010 11:03 AM  Follow-up for Phone Call        Rf was just sent for Ketoconazole on 02-25-10.  Rf for Crestor sent. Follow-up by: Lanier Prude, [facility]),  March 24, 2010 11:11 AM  Additional Follow-up for Phone Call Additional follow up Details #1::        OK Thx! Additional Follow-up by: Tresa Garter MD,  March 24, 2010 1:36 PM    Prescriptions: CRESTOR 40 MG TABS (ROSUVASTATIN CALCIUM) 1 tablet by mouth daily  #90 x 2   Entered by:   Lanier Prude, [facility])   Authorized by:   Tresa Garter MD   Signed by:   Lanier Prude, [facility]) on 03/24/2010   Method used:   [facility]        [facility]  [address] [phone]* (retail)       [address]       [place], [place]  [address]       Ph: [phone] or [phone]       Fax: [phone]   RxID:   [id number]

## 2010-08-31 NOTE — Letter (Signed)
Summary: Generic Letter  [facility]  [address] [place], [place] [address]   Phone: [phone]  Fax: [phone]    05/27/2010  TO: Dr Thurston Hole  RE: Nathaniel Harvey [address] [place], [place]  [address]  Dear Dr Thurston Hole,     Mr. Nathaniel Harvey asked me to let you know that he is ready for a TKR of his R knee if you think it is appropriate. Thank you!           Sincerely,   Jacinta Shoe MD

## 2010-08-31 NOTE — Assessment & Plan Note (Signed)
Summary: 3 MO ROV /NWS  #   Vital Signs:  Patient profile:   77 year old male Height:      64 inches Weight:      175 pounds BMI:     30.15 Temp:     98.0 degrees F oral Pulse rate:   64 / minute Pulse rhythm:   regular Resp:     16 per minute BP sitting:   112 / 74  (left arm) Cuff size:   regular  Vitals Entered By: Lanier Prude, CMA(AAMA) (May 27, 2010 7:56 AM) CC: 3 mo f/u Comments pt is not takingranitidine, meloxicam, desonide or anusol   CC:  3 mo f/u.  History of Present Illness: C/o R knee pain is worse F/u hyperlipidemia, rash, hemorrhoids, GERD  Current Medications (verified): 1)  Ranitidine Hcl 300 Mg  Caps (Ranitidine Hcl) .Marland Kitchen.. 1 By Mouth Qd 2)  Vitamin D3 1000 Unit  Tabs (Cholecalciferol) .Marland Kitchen.. 1 Qd 3)  Crestor 40 Mg Tabs (Rosuvastatin Calcium) .Marland Kitchen.. 1 Tablet By Mouth Daily 4)  Stool Softener Plus Laxative 8.6-50 Mg  Tabs (Sennosides-Docusate Sodium) .... 2 By Mouth Bid 5)  Meloxicam 15 Mg Tabs (Meloxicam) .... One By Mouth Daily 6)  Ocuvite-Lutein  Caps (Multiple Vitamins-Minerals) .... Once Daily 7)  Tramadol Hcl 50 Mg  Tabs (Tramadol Hcl) .Marland Kitchen.. 1-2 By Mouth Two Times A Day As Needed Pain 8)  Desonide 0.05 % Crea (Desonide) .... Use Bid 9)  Ketoconazole 2 % Crea (Ketoconazole) .... Use Two Times A Day 10)  Anusol-Hc 2.5 % Crea (Hydrocortisone) .... Use Two Times A Day For Hemorrhoids  Allergies (verified): 1)  ! Lipitor 2)  Mobic (Meloxicam)  Past History:  Past Medical History: Last updated: 02/19/2009 GERD Osteoarthritis Peripheral vascular disease Hemorrhoids Elev. PSA 2008 Hyperlipidemia XRT exposure in the 80's B knee pain L>R  Past Surgical History: Last updated: 02/23/2007 Vagotomy Nissen fundoplication modification  Social History: Last updated: 07/10/2007 Retire Former Smoker Alcohol use-yes  Regular exercise-yes Single Widower  Review of Systems       The patient complains of difficulty walking.  The patient denies  fever, chest pain, dyspnea on exertion, prolonged cough, hematochezia, and severe indigestion/heartburn.    Physical Exam  General:  Well-developed,well-nourished,in no acute distress; alert,appropriate and cooperative throughout examination Mouth:  Oral mucosa and oropharynx without lesions or exudates.  Teeth in good repair. Lungs:  Normal respiratory effort, chest expands symmetrically. Lungs are clear to auscultation, no crackles or wheezes. Heart:  Normal rate and regular rhythm. S1 and S2 normal without gallop, murmur, click, rub or other extra sounds. Abdomen:  Bowel sounds positive,abdomen soft and non-tender without masses, organomegaly or hernias noted. Msk:  Mabeline Caras back with a chronic scoliosis change.  Knees w/OA deformities. R knee is worse now Neurologic:  No cranial nerve deficits noted. Station and gait are normal. Plantar reflexes are down-going bilaterally. DTRs are symmetrical throughout. Sensory, motor and coordinative functions appear intact. Skin:  Hyperpigm  skin on B ankles  Psych:  Cognition and judgment appear intact. Alert and cooperative with normal attention span and concentration. No apparent delusions, illusions, hallucinations   Impression & Recommendations:  Problem # 1:  KNEE PAIN (ICD-719.46) R end stage OA Assessment Deteriorated He would like to have surgery if OK with Dr Thurston Hole The following medications were removed from the medication list:    Meloxicam 15 Mg Tabs (Meloxicam) ..... One by mouth daily    Tramadol Hcl 50 Mg Tabs (Tramadol hcl) .Marland Kitchen... 1-2  by mouth two times a day as needed pain His updated medication list for this problem includes:    Hydrocodone-acetaminophen 5-325 Mg Tabs (Hydrocodone-acetaminophen) .Marland Kitchen... 1-2 by mouth two times a day as needed pain  Orders: TLB-BMP (Basic Metabolic Panel-BMET) (80048-METABOL)  Problem # 2:  RASH AND OTHER NONSPECIFIC SKIN ERUPTION (ICD-782.1) Assessment: Unchanged  The following medications were  removed from the medication list:    Desonide 0.05 % Crea (Desonide) ..... Use bid His updated medication list for this problem includes:    Ketoconazole 2 % Crea (Ketoconazole) ..... Use two times a day  Problem # 3:  HYPERLIPIDEMIA (ICD-272.4) Assessment: Unchanged  His updated medication list for this problem includes:    Crestor 40 Mg Tabs (Rosuvastatin calcium) .Marland Kitchen... 1 tablet by mouth daily  Orders: TLB-BMP (Basic Metabolic Panel-BMET) (80048-METABOL)  Problem # 4:  PRURITUS (ICD-698.9) Assessment: Improved  Problem # 5:  GERD (ICD-530.81) Assessment: Improved  The following medications were removed from the medication list:    Ranitidine Hcl 300 Mg Caps (Ranitidine hcl) .Marland Kitchen... 1 by mouth qd  Complete Medication List: 1)  Vitamin D3 1000 Unit Tabs (Cholecalciferol) .Marland Kitchen.. 1 qd 2)  Crestor 40 Mg Tabs (Rosuvastatin calcium) .Marland Kitchen.. 1 tablet by mouth daily 3)  Stool Softener Plus Laxative 8.6-50 Mg Tabs (Sennosides-docusate sodium) .... 2 by mouth bid 4)  Ocuvite-lutein Caps (Multiple vitamins-minerals) .... Once daily 5)  Ketoconazole 2 % Crea (Ketoconazole) .... Use two times a day 6)  Hydrocodone-acetaminophen 5-325 Mg Tabs (Hydrocodone-acetaminophen) .Marland Kitchen.. 1-2 by mouth two times a day as needed pain 7)  Voltaren 1 % Gel (Diclofenac sodium) .... Use two times a day on a joint(s) 1"  Other Orders: Flu Vaccine 33yrs + MEDICARE PATIENTS (E4540) Administration Flu vaccine - MCR (J8119)  Patient Instructions: 1)  Please schedule a follow-up appointment in 3 months. Prescriptions: KETOCONAZOLE 2 % CREA (KETOCONAZOLE) use two times a day  #30g x 4 x 3   Entered and Authorized by:   Tresa Garter MD   Signed by:   Tresa Garter MD on 05/27/2010   Method used:   Print then Give to Patient   RxID:   [id number] CRESTOR 40 MG TABS (ROSUVASTATIN CALCIUM) 1 tablet by mouth daily  #90 x 3   Entered and Authorized by:   Tresa Garter MD   Signed by:   Tresa Garter MD on 05/27/2010   Method used:   Print then Give to Patient   RxID:   [id number] VOLTAREN 1 % GEL (DICLOFENAC SODIUM) use two times a day on a joint(s) 1"  #90 g x 3   Entered and Authorized by:   Tresa Garter MD   Signed by:   Tresa Garter MD on 05/27/2010   Method used:   Print then Give to Patient   RxID:   [id number] HYDROCODONE-ACETAMINOPHEN 5-325 MG TABS (HYDROCODONE-ACETAMINOPHEN) 1-2 by mouth two times a day as needed pain  #120 x 2   Entered and Authorized by:   Tresa Garter MD   Signed by:   Tresa Garter MD on 05/27/2010   Method used:   Print then Give to Patient   RxID:   [id number]    Orders Added: 1)  Est. Patient Level IV [51884] 2)  TLB-BMP (Basic Metabolic Panel-BMET) [80048-METABOL] 3)  Flu Vaccine 16yrs + MEDICARE PATIENTS [Q2039] 4)  Administration Flu vaccine - MCR [G0008]   Influenza Vaccine (to be given today)     .  [email or web address]   Flu Vaccine Consent Questions     Do you have a history of severe allergic reactions to this vaccine? no    Any prior history of allergic reactions to egg and/or gelatin? no    Do you have a sensitivity to the preservative Thimersol? no    Do you have a past history of Guillan-Barre Syndrome? no    Do you currently have an acute febrile illness? no    Have you ever had a severe reaction to latex? no    Vaccine information given and explained to patient? yes    Are you currently pregnant? no    Lot Number:[id number]   Exp Date:01/28/2011   Site Given  Right Deltoid IM Lanier Prude, [facility])  May 27, 2010 8:31 AM

## 2010-08-31 NOTE — Assessment & Plan Note (Signed)
Summary: 4 MOS / #/ CD   Vital Signs:  Patient profile:   77 year old male Height:      64 inches Weight:      162 pounds BMI:     27.91 Temp:     98.2 degrees F oral Pulse rate:   86 / minute BP sitting:   96 / 64  (left arm)  Vitals Entered By: Tora Perches (October 20, 2009 10:04 AM) CC: f/ Is Patient Diabetic? No   CC:  f/.  History of Present Illness: The patient presents for a follow up of rash, elev PSA,  hyperlipidemia   Preventive Screening-Counseling & Management  Alcohol-Tobacco     Smoking Status: quit  Current Medications (verified): 1)  Ranitidine Hcl 300 Mg  Caps (Ranitidine Hcl) .Marland Kitchen.. 1 By Mouth Qd 2)  Vitamin D3 1000 Unit  Tabs (Cholecalciferol) .Marland Kitchen.. 1 Qd 3)  Crestor 40 Mg Tabs (Rosuvastatin Calcium) .Marland Kitchen.. 1 Tablet By Mouth Daily 4)  Stool Softener Plus Laxative 8.6-50 Mg  Tabs (Sennosides-Docusate Sodium) .... 2 By Mouth Bid 5)  Meloxicam 15 Mg Tabs (Meloxicam) .... One By Mouth Daily 6)  Triamcinolone Acetonide 0.5 % Crea (Triamcinolone Acetonide) .... Apply Bid To Affected Area 7)  Ocuvite-Lutein  Caps (Multiple Vitamins-Minerals) .... Once Daily 8)  Tramadol Hcl 50 Mg  Tabs (Tramadol Hcl) .Marland Kitchen.. 1-2 By Mouth Two Times A Day As Needed Pain  Allergies: 1)  ! Lipitor  Past History:  Past Medical History: Last updated: 02/19/2009 GERD Osteoarthritis Peripheral vascular disease Hemorrhoids Elev. PSA 2008 Hyperlipidemia XRT exposure in the 80's B knee pain L>R  Past Surgical History: Last updated: 02/23/2007 Vagotomy Nissen fundoplication modification  Social History: Last updated: 07/10/2007 Retire Former Smoker Alcohol use-yes  Regular exercise-yes Single Widower  Social History: Smoking Status:  quit  Physical Exam  General:  Well-developed,well-nourished,in no acute distress; alert,appropriate and cooperative throughout examination Nose:  External nasal examination shows no deformity or inflammation. Nasal mucosa are pink and  moist without lesions or exudates. Mouth:  Oral mucosa and oropharynx without lesions or exudates.  Teeth in good repair. Lungs:  Normal respiratory effort, chest expands symmetrically. Lungs are clear to auscultation, no crackles or wheezes. Heart:  Normal rate and regular rhythm. S1 and S2 normal without gallop, murmur, click, rub or other extra sounds. Abdomen:  Bowel sounds positive,abdomen soft and non-tender without masses, organomegaly or hernias noted. Msk:  Mabeline Caras back with a chronic scoliosis change.  Knees w/OA deformities Neurologic:  No cranial nerve deficits noted. Station and gait are normal. Plantar reflexes are down-going bilaterally. DTRs are symmetrical throughout. Sensory, motor and coordinative functions appear intact. Skin:  Residuar purple maculas Red tracks on R ankle and erythma White papules on cheeks Psych:  Cognition and judgment appear intact. Alert and cooperative with normal attention span and concentration. No apparent delusions, illusions, hallucinations   Impression & Recommendations:  Problem # 1:  RASH AND OTHER NONSPECIFIC SKIN ERUPTION (ICD-782.1) R foot Assessment Unchanged  [place]ed in Aug [place] - ? larvae: try Albendazole The following medications were removed from the medication list:    Triamcinolone Acetonide 0.5 % Crea (Triamcinolone acetonide) .Marland Kitchen... Apply bid to affected area His updated medication list for this problem includes:    Desonide 0.05 % Crea (Desonide) ..... Use bid    Ketoconazole 2 % Crea (Ketoconazole) ..... Use bid  Orders: TLB-TSH (Thyroid Stimulating Hormone) (84443-TSH) TLB-Lipid Panel (80061-LIPID) TLB-BMP (Basic Metabolic Panel-BMET) (80048-METABOL) TLB-Hepatic/Liver Function  Pnl (80076-HEPATIC) TLB-CBC Platelet - w/Differential (85025-CBCD) TLB-PSA (Prostate Specific Antigen) (84153-PSA)  Problem # 2:  KNEE PAIN (WJX-914.78) Assessment: Unchanged  His updated medication list for this  problem includes:    Meloxicam 15 Mg Tabs (Meloxicam) ..... One by mouth daily    Tramadol Hcl 50 Mg Tabs (Tramadol hcl) .Marland Kitchen... 1-2 by mouth two times a day as needed pain  Orders: TLB-TSH (Thyroid Stimulating Hormone) (84443-TSH) TLB-Lipid Panel (80061-LIPID) TLB-BMP (Basic Metabolic Panel-BMET) (80048-METABOL) TLB-Hepatic/Liver Function Pnl (80076-HEPATIC) TLB-CBC Platelet - w/Differential (85025-CBCD) TLB-PSA (Prostate Specific Antigen) (84153-PSA) Prescription Created Electronically [phone])  Problem # 3:  HYPERLIPIDEMIA (ICD-272.4) Assessment: Improved  His updated medication list for this problem includes:    Crestor 40 Mg Tabs (Rosuvastatin calcium) .Marland Kitchen... 1 tablet by mouth daily  Orders: TLB-TSH (Thyroid Stimulating Hormone) (84443-TSH) TLB-Lipid Panel (80061-LIPID) TLB-BMP (Basic Metabolic Panel-BMET) (80048-METABOL) TLB-Hepatic/Liver Function Pnl (80076-HEPATIC) TLB-CBC Platelet - w/Differential (85025-CBCD) TLB-PSA (Prostate Specific Antigen) (84153-PSA)  Problem # 4:  SCOLIOSIS, IDIOPATHIC (ICD-737.30) Assessment: Unchanged  Complete Medication List: 1)  Ranitidine Hcl 300 Mg Caps (Ranitidine hcl) .Marland Kitchen.. 1 by mouth qd 2)  Vitamin D3 1000 Unit Tabs (Cholecalciferol) .Marland Kitchen.. 1 qd 3)  Crestor 40 Mg Tabs (Rosuvastatin calcium) .Marland Kitchen.. 1 tablet by mouth daily 4)  Stool Softener Plus Laxative 8.6-50 Mg Tabs (Sennosides-docusate sodium) .... 2 by mouth bid 5)  Meloxicam 15 Mg Tabs (Meloxicam) .... One by mouth daily 6)  Ocuvite-lutein Caps (Multiple vitamins-minerals) .... Once daily 7)  Tramadol Hcl 50 Mg Tabs (Tramadol hcl) .Marland Kitchen.. 1-2 by mouth two times a day as needed pain 8)  Desonide 0.05 % Crea (Desonide) .... Use bid 9)  Albenza 200 Mg Tabs (Albendazole) .... 2 by mouth two times a day 10)  Ketoconazole 2 % Crea (Ketoconazole) .... Use bid  Patient Instructions: 1)  Please schedule a follow-up appointment in 4 months. Prescriptions: TRAMADOL HCL 50 MG  TABS (TRAMADOL HCL)  1-2 by mouth two times a day as needed pain  #120 x 3   Entered and Authorized by:   Tresa Garter MD   Signed by:   Tresa Garter MD on 10/20/2009   Method used:   Electronically to        [facility] [address] [phone]* (retail)       [id number]       [place], [place]  [address]       Ph: [phone] or [phone]       Fax: [phone]   RxID:   [id number] KETOCONAZOLE 2 % CREA (KETOCONAZOLE) use bid  #90 g x 3   Entered and Authorized by:   Tresa Garter MD   Signed by:   Tresa Garter MD on 10/20/2009   Method used:   Print then Give to Patient   RxID:   [id number] ALBENZA 200 MG TABS (ALBENDAZOLE) 2 by mouth two times a day  #12 x 0   Entered and Authorized by:   Tresa Garter MD   Signed by:   Tresa Garter MD on 10/20/2009   Method used:   Print then Give to Patient   RxID:   [id number] DESONIDE 0.05 % CREA (DESONIDE) use bid  #120 g x 3   Entered and Authorized by:   Tresa Garter MD   Signed by:   Tresa Garter MD on 10/20/2009   Method used:   Print then Give to Patient   RxID:  [id number]  

## 2010-08-31 NOTE — Letter (Signed)
Summary: [facility]  [facility]   Imported By: Lennie Odor 04/15/2010 12:11:22  _____________________________________________________________________  External Attachment:    Type:   Image     Comment:   External Document

## 2010-09-01 NOTE — Assessment & Plan Note (Signed)
Summary: 3 MO ROV /NWS   Vital Signs:  Patient profile:   77 year old male Height:      64 inches Weight:      171 pounds BMI:     29.46 Temp:     97.8 degrees F oral Pulse rate:   72 / minute Pulse rhythm:   regular Resp:     16 per minute BP sitting:   130 / 70  (left arm) Cuff size:   regular  Vitals Entered By: Lanier Prude, CMA(AAMA) (August 12, 2010 8:05 AM) CC: 3 mo f/u  Is Patient Diabetic? No   CC:  3 mo f/u .  History of Present Illness: IM Consult: Req by Dr Thurston Hole Reason Med clearance for TKR R Jan 23 Hx: We need to address issues with hemorrhoids, edema, pruritis, hyperlipidemia - all stable C/o Vicodins are too large to swallow  Current Medications (verified): 1)  Vitamin D3 1000 Unit  Tabs (Cholecalciferol) .Marland Kitchen.. 1 Qd 2)  Crestor 40 Mg Tabs (Rosuvastatin Calcium) .Marland Kitchen.. 1 Tablet By Mouth Daily 3)  Stool Softener Plus Laxative 8.6-50 Mg  Tabs (Sennosides-Docusate Sodium) .... 2 By Mouth Bid 4)  Ocuvite-Lutein  Caps (Multiple Vitamins-Minerals) .... Once Daily 5)  Ketoconazole 2 % Crea (Ketoconazole) .... Use Two Times A Day 6)  Hydrocodone-Acetaminophen 5-325 Mg Tabs (Hydrocodone-Acetaminophen) .Marland Kitchen.. 1-2 By Mouth Two Times A Day As Needed Pain 7)  Voltaren 1 % Gel (Diclofenac Sodium) .... Use Two Times A Day On A Joint(S) 1" 8)  Triamcinolone Acetonide 0.1 % Crea (Triamcinolone Acetonide) .... As Needed As Directed  Allergies (verified): 1)  ! Lipitor 2)  Mobic (Meloxicam)  Past History:  Past Medical History: Last updated: 02/19/2009 GERD Osteoarthritis Peripheral vascular disease Hemorrhoids Elev. PSA 2008 Hyperlipidemia XRT exposure in the 80's B knee pain L>R  Family History: Last updated: 02/23/2007 unknown  Social History: Last updated: 07/10/2007 Retire Former Smoker Alcohol use-yes  Regular exercise-yes Single Widower  Past Surgical History: Vagotomy 12/98 Nissen fundoplication modification R foot fracture repair R knee  torn ligament repair 1991 Lipoma removal on back 2010  Review of Systems       The patient complains of difficulty walking.  The patient denies anorexia, fever, weight loss, weight gain, vision loss, decreased hearing, hoarseness, chest pain, syncope, dyspnea on exertion, peripheral edema, prolonged cough, headaches, hemoptysis, abdominal pain, melena, hematochezia, severe indigestion/heartburn, hematuria, incontinence, genital sores, muscle weakness, suspicious skin lesions, transient blindness, depression, unusual weight change, abnormal bleeding, enlarged lymph nodes, angioedema, and testicular masses.    Physical Exam  General:  Well-developed,well-nourished,in no acute distress; alert,appropriate and cooperative throughout examination Head:  Normocephalic and atraumatic without obvious abnormalities. No apparent alopecia or balding. Eyes:  No corneal or conjunctival inflammation noted. EOMI. Perrla.  Ears:  External ear exam shows no significant lesions or deformities.  Otoscopic examination reveals clear canals, tympanic membranes are intact bilaterally without bulging, retraction, inflammation or discharge. Hearing is grossly normal bilaterally. Nose:  External nasal examination shows no deformity or inflammation. Nasal mucosa are pink and moist without lesions or exudates. Mouth:  Oral mucosa and oropharynx without lesions or exudates.  Teeth in good repair. Neck:  No deformities, masses, or tenderness noted. Chest Wall:  scoliotic Lungs:  Normal respiratory effort, chest expands symmetrically. Lungs are clear to auscultation, no crackles or wheezes. Heart:  Normal rate and regular rhythm. S1 and S2 normal without gallop, murmur, click, rub or other extra sounds. Abdomen:  Bowel sounds positive,abdomen soft and non-tender  without masses, organomegaly or hernias noted. Rectal:  external hemorrhoid(s).  G(-) stool Perianal  dermatitis is present Prostate:  no nodules and 1+ enlarged.     Msk:  Mabeline Caras back with a chronic scoliosis change.  Knees w/OA deformities. R knee is worse now Pulses:  R and L carotid,radial,femoral,dorsalis pedis and posterior tibial pulses are full and equal bilaterally Extremities:  B no edema Neurologic:  No cranial nerve deficits noted. Station and gait are normal. Plantar reflexes are down-going bilaterally. DTRs are symmetrical throughout. Sensory, motor and coordinative functions appear intact. Skin:  Hyperpigm  skin on B ankles  Psych:  Cognition and judgment appear intact. Alert and cooperative with normal attention span and concentration. No apparent delusions, illusions, hallucinations   Impression & Recommendations:  Problem # 1:  PREOPERATIVE EXAMINATION (ICD-V72.84) Assessment New He has an appt for pre-op EKG, CXR and labs on 1/17 He is refusing stress test etc. He should be medically clear for TKR surgery assuming his labs on 1/17 are acceptable. Thank you!  Problem # 2:  PSA, INCREASED (ICD-790.93) Assessment: Unchanged Refusing Urol consult. Cont to monitor  Problem # 3:  HYPERLIPIDEMIA (ICD-272.4) Assessment: Unchanged  His updated medication list for this problem includes:    Crestor 40 Mg Tabs (Rosuvastatin calcium) .Marland Kitchen... 1 tablet by mouth daily  Labs Reviewed: SGOT: 31 (02/25/2010)   SGPT: 34 (02/25/2010)   HDL:75.80 (02/25/2010), 65.30 (06/23/2009)  LDL:92 (02/25/2010), 122 (62/95/2841)  Chol:181 (02/25/2010), 212 (06/23/2009)  Trig:65.0 (02/25/2010), 68.0 (06/23/2009)  Problem # 4:  VENOUS INSUFFICIENCY, LEGS (ICD-459.81) Assessment: Unchanged  Problem # 5:  HEMORRHOIDS (ICD-455.6) Assessment: Unchanged On the regimen of medicine(s) reflected in the chart    Problem # 6:  GERD (ICD-530.81) Assessment: Unchanged  His updated medication list for this problem includes:    Ranitidine Hcl 300 Mg Tabs (Ranitidine hcl) .Marland Kitchen... 1 by mouth once daily for indigestion  Problem # 7:  PRURITUS ANI (ICD-698.0) Assessment:  Deteriorated On the regimen of medicine(s) reflected in the chart    Complete Medication List: 1)  Vitamin D3 1000 Unit Tabs (Cholecalciferol) .Marland Kitchen.. 1 qd 2)  Crestor 40 Mg Tabs (Rosuvastatin calcium) .Marland Kitchen.. 1 tablet by mouth daily 3)  Stool Softener Plus Laxative 8.6-50 Mg Tabs (Sennosides-docusate sodium) .... 2 by mouth bid 4)  Ocuvite-lutein Caps (Multiple vitamins-minerals) .... Once daily 5)  Ketoconazole 2 % Crea (Ketoconazole) .... Use two times a day 6)  Voltaren 1 % Gel (Diclofenac sodium) .... Use two times a day on a joint(s) 1" 7)  Triamcinolone Acetonide 0.1 % Crea (Triamcinolone acetonide) .... As needed as directed 8)  Ranitidine Hcl 300 Mg Tabs (Ranitidine hcl) .Marland Kitchen.. 1 by mouth once daily for indigestion 9)  Tramadol Hcl 50 Mg Tabs (Tramadol hcl) .Marland Kitchen.. 1-2 tabs by mouth two times a day as needed pain  Other Orders: Pneumococcal Vaccine (32440) Admin 1st Vaccine (10272)  Patient Instructions: 1)  Please schedule a follow-up appointment in 4 months. Prescriptions: TRIAMCINOLONE ACETONIDE 0.1 % CREA (TRIAMCINOLONE ACETONIDE) as needed as directed  #240g x 0   Entered and Authorized by:   Tresa Garter MD   Signed by:   Tresa Garter MD on 08/12/2010   Method used:   Print then Give to Patient   RxID:   [id number] VOLTAREN 1 % GEL (DICLOFENAC SODIUM) use two times a day on a joint(s) 1"  #180g x 3   Entered and Authorized by:   Tresa Garter MD   Signed by:  Tresa Garter MD on 08/12/2010   Method used:   Print then Give to Patient   RxID:   [id number] KETOCONAZOLE 2 % CREA (KETOCONAZOLE) use two times a day  #240g x 3   Entered and Authorized by:   Tresa Garter MD   Signed by:   Tresa Garter MD on 08/12/2010   Method used:   Print then Give to Patient   RxID:   [id number] TRAMADOL HCL 50 MG TABS (TRAMADOL HCL) 1-2 tabs by mouth two times a day as needed pain  #360 x 1   Entered and Authorized by:   Tresa Garter MD   Signed by:   Tresa Garter MD on 08/12/2010   Method used:   Print then Give to Patient   RxID:   [id number] RANITIDINE HCL 300 MG TABS (RANITIDINE HCL) 1 by mouth once daily for indigestion  #90 x 3   Entered and Authorized by:   Tresa Garter MD   Signed by:   Tresa Garter MD on 08/12/2010   Method used:   Print then Give to Patient   RxID:   [id number] TRIAMCINOLONE ACETONIDE 0.1 % CREA (TRIAMCINOLONE ACETONIDE) as needed as directed  #120g x 0   Entered and Authorized by:   Tresa Garter MD   Signed by:   Tresa Garter MD on 08/12/2010   Method used:   Print then Give to Patient   RxID:   [id number] VOLTAREN 1 % GEL (DICLOFENAC SODIUM) use two times a day on a joint(s) 1"  #180g x 3   Entered and Authorized by:   Tresa Garter MD   Signed by:   Tresa Garter MD on 08/12/2010   Method used:   Print then Give to Patient   RxID:   [id number] KETOCONAZOLE 2 % CREA (KETOCONAZOLE) use two times a day  #120g x 3   Entered and Authorized by:   Tresa Garter MD   Signed by:   Tresa Garter MD on 08/12/2010   Method used:   Print then Give to Patient   RxID:   [id number] CRESTOR 40 MG TABS (ROSUVASTATIN CALCIUM) 1 tablet by mouth daily  #90 x 3   Entered and Authorized by:   Tresa Garter MD   Signed by:   Tresa Garter MD on 08/12/2010   Method used:   Print then Give to Patient   RxID:   [id number]    Orders Added: 1)  Pneumococcal Vaccine [79892] 2)  Admin 1st Vaccine [90471] 3)  Consultation Level IV [11941]   Immunizations Administered:  Pneumonia Vaccine:    Vaccine Type: Pneumovax    Site: left deltoid    Mfr: Merck    Dose: 0.5 ml    Route: IM    Given by: Lanier Prude, CMA(AAMA)    Exp. Date: 12/23/2011    Lot #: [id number]    VIS given: 07/05/09 version given August 12, 2010.   Immunizations Administered:  Pneumonia Vaccine:    Vaccine Type:  Pneumovax    Site: left deltoid    Mfr: Merck    Dose: 0.5 ml    Route: IM    Given by: Lanier Prude, CMA(AAMA)    Exp. Date: 12/23/2011    Lot #: [id number]    VIS given: 07/05/09 version given August 12, 2010.

## 2010-09-05 ENCOUNTER — Encounter: Payer: Self-pay | Admitting: Internal Medicine

## 2010-09-21 NOTE — Letter (Signed)
Summary: [facility]  [facility]   Imported By: Sherian Rein 09/14/2010 08:37:13  _____________________________________________________________________  External Attachment:    Type:   Image     Comment:   External Document

## 2010-09-21 NOTE — Letter (Signed)
Summary: [facility]  [facility]   Imported By: Sherian Rein 09/14/2010 08:38:40  _____________________________________________________________________  External Attachment:    Type:   Image     Comment:   External Document

## 2010-09-29 ENCOUNTER — Encounter: Payer: Self-pay | Admitting: Internal Medicine

## 2010-10-06 NOTE — Discharge Summary (Signed)
NAMEBAYDEN, Nathaniel Harvey               ACCOUNT NO.:  [id number]  MEDICAL RECORD NO.:  [id number]          PATIENT TYPE:  INP  LOCATION:  5012                         FACILITY:  [facility]  PHYSICIAN:  Rishard Delange A. Thurston Hole, M.D. DATE OF BIRTH:  [birth year]  DATE OF ADMISSION:  08/22/2010 DATE OF DISCHARGE:                              DISCHARGE SUMMARY   ADMITTING DIAGNOSES:  End-stage degenerative joint disease right knee, high cholesterol, gastroesophageal reflux disease, hiatal hernia, peripheral vascular disease, hemorrhoids.  DISCHARGE DIAGNOSES:  End-stage degenerative joint disease right knee, status post total knee replacement, acute blood loss anemia, tachycardia, hiatal hernia, gastroesophageal reflux disease, peripheral vascular disease, hemorrhoids, and hyperlipidemia.  HISTORY OF PRESENT ILLNESS:  The patient is a 77 year old white male with a history of end-stage DJD of his right knee.  He has failed conservative care including anti-inflammatories and interarticular cortisone injections.  He comes in today .  He came in for a right total knee replacement.  PROCEDURES IN-HOUSE:  On August 22, 2010, the patient underwent a right total knee replacement by Dr. Thurston Hole.  A right femoral nerve block by anesthesia and a 500 mL Autovac transfusion.  HOSPITAL COURSE:  The patient was admitted postoperatively for pain management and physical therapy.  He was placed on a CPM of 0 to 90 degrees, postop day 0 in the recovery room, tolerated this well.  Postop day #1, social work was consulted for a skilled nursing placement.  He ambulated approximately 100 feet with physical therapy.  On one visit, he declined physical therapy.  On the second visit, he only tolerated the CPM for 2 hours and 15 minutes.  Postop day #2, the patient's hemoglobin was 9.9.  He had significant tachycardia with a pulse of 116, O2 sats in the 90s, and cardiac enzymes were ordered and a chest CT to rule out a PE  was ordered.  Chest CT showed multiple small nodules and the radiologist has recommended follow up CT in 3-6 months to rule out bronchogenic carcinoma.  He has a large blister on the medial aspect of his wound and a moderate amount of ecchymosis.  He was given a Dulcolax suppository with results, ambulated 100 feet.  He turned down the second session of physical therapy, but did use his CPM.  Postop day #3, the patient continues to do well.  Notes no complaints, no chest pain, no shortness of breath.  He continues to be somewhat tachycardic with a pulse of 107, O2 sat 95% on room air.  His hemoglobin is 8.5 and appears to be stable.  He is being discharged to a skilled nursing facility. Weightbearing as tolerated.  He will need to prop his heel up on a folded pillow for 30 minutes every single day.  He will need to use a CPM of 0 to 70 degrees, 6 hours a day.  This can be done 2 hours in the morning, 2 hours in the afternoon, 2 hours in the evening or 3 and 3. He needs to increase by 10 degrees a day, so that he is at 90 degrees. He will need physical therapy daily for gait  training.  He will need occupational therapy daily.  He needs to began showering on August 29, 2010.  His wound needs to be washed with soap and water every single day.  A Mepilex border dressing needs to be applied due to his open blister.  He needs to follow up with Dr. Wyline Mood on September 04, 2010, for a wound check and x-rays.  Please call our office [phone] to make schedule this appointment.  He is on a regular diet.  He has weightbearing as tolerated.  Please call our office with increased pain, increased redness, increased swelling, or a temperature greater than 101.  He does have a significant amount of ecchymosis in the right lower extremity.  He does need to wear a TED hose every single day to prevent DVT.  DISCHARGE MEDICATIONS: 1. Triamcinolone cream 1 application daily as needed. 2. Xarelto 10 mg 1  tablet daily.  Please stop this medication on     September 04, 2010. 3. Ketoconazole cream daily. 4. Colace 100 mg twice a day while on narcotics. 5. Dulcolax suppository if no BM in 2 days. 6. Enema as needed. 7. Robaxin 500 mg 1 tablet every 6 hours p.r.n. muscle spasm. 8. Crestor 1 tablet a day. 9. Ranitidine 300 mg 1 tablet daily. 10.Ocuvite 1 tablet daily. 11.Percocet 5/325 one to two every 4 to 6 hours p.r.n. pain. 12.He has been instructed to stop his Voltaren gel. 13.Vitamin D 3000 units daily.  The patient's regular medical doctor is Dr. Posey Rea, but there are any medical problems.  Please call Dr. Posey Rea.  For any orthopedic problems, please call Dr. Wyline Mood.     Kirstin Shepperson, P.A.   ______________________________ Elana Alm Thurston Hole, M.D.    KS/MEDQ  D:  08/25/2010  T:  08/25/2010  Job:  [id number]  Electronically Signed by Julien Girt P.A. on 10/03/2010 03:05:37 PM Electronically Signed by Salvatore Marvel M.D. on 10/05/2010 02:01:24 PM

## 2010-12-16 ENCOUNTER — Ambulatory Visit: Payer: Self-pay | Admitting: Internal Medicine

## 2011-01-02 ENCOUNTER — Encounter: Payer: Self-pay | Admitting: Internal Medicine

## 2011-01-03 ENCOUNTER — Ambulatory Visit (INDEPENDENT_AMBULATORY_CARE_PROVIDER_SITE_OTHER): Payer: 59 | Admitting: Internal Medicine

## 2011-01-03 ENCOUNTER — Encounter: Payer: Self-pay | Admitting: Internal Medicine

## 2011-01-03 ENCOUNTER — Ambulatory Visit: Payer: Self-pay | Admitting: Internal Medicine

## 2011-01-03 DIAGNOSIS — I872 Venous insufficiency (chronic) (peripheral): Secondary | ICD-10-CM

## 2011-01-03 DIAGNOSIS — M25569 Pain in unspecified knee: Secondary | ICD-10-CM

## 2011-01-03 DIAGNOSIS — R209 Unspecified disturbances of skin sensation: Secondary | ICD-10-CM

## 2011-01-03 DIAGNOSIS — E785 Hyperlipidemia, unspecified: Secondary | ICD-10-CM

## 2011-01-03 MED ORDER — AZITHROMYCIN 250 MG PO TABS: 250.0000 mg | ORAL_TABLET | Freq: Every day | ORAL | Status: AC

## 2011-01-03 MED ORDER — BENZONATATE 100 MG PO CAPS: 100.0000 mg | ORAL_CAPSULE | Freq: Three times a day (TID) | ORAL | Status: AC | PRN

## 2011-01-03 MED ORDER — BENZONATATE 100 MG PO CAPS: 100.0000 mg | ORAL_CAPSULE | Freq: Three times a day (TID) | ORAL | Status: DC | PRN

## 2011-01-03 MED ORDER — AZITHROMYCIN 250 MG PO TABS: 250.0000 mg | ORAL_TABLET | Freq: Every day | ORAL | Status: DC

## 2011-01-03 NOTE — Assessment & Plan Note (Signed)
Doing better post-op 

## 2011-01-03 NOTE — Assessment & Plan Note (Signed)
Better  

## 2011-01-03 NOTE — Progress Notes (Signed)
  Subjective:    Patient ID: Nathaniel Harvey, male    DOB: 03-10-1934, 77 y.o.   MRN: [id number]  HPI  The patient presents for a follow-up of  chronic hypertension, chronic dyslipidemia, OA controlled with medicines Knee is better post-op    Review of Systems  Constitutional: Negative for appetite change, fatigue and unexpected weight change.  HENT: Negative for nosebleeds, congestion, sore throat, sneezing, trouble swallowing and neck pain.   Eyes: Negative for itching and visual disturbance.  Respiratory: Negative for cough.   Cardiovascular: Negative for chest pain, palpitations and leg swelling.  Gastrointestinal: Negative for nausea, diarrhea, blood in stool and abdominal distention.  Genitourinary: Negative for frequency and hematuria.  Musculoskeletal: Positive for joint swelling (better) and gait problem. Negative for back pain.  Skin: Negative for rash.  Neurological: Negative for dizziness, tremors, speech difficulty and weakness.  Psychiatric/Behavioral: Negative for sleep disturbance, dysphoric mood and agitation. The patient is not nervous/anxious.        Objective:   Physical Exam  Constitutional: He is oriented to person, place, and time. He appears well-developed.  HENT:  Mouth/Throat: Oropharynx is clear and moist.  Eyes: Conjunctivae are normal. Pupils are equal, round, and reactive to light.  Neck: Normal range of motion. No JVD present. No thyromegaly present.  Cardiovascular: Normal rate, regular rhythm, normal heart sounds and intact distal pulses.  Exam reveals no gallop and no friction rub.   No murmur heard. Pulmonary/Chest: Effort normal and breath sounds normal. No respiratory distress. He has no wheezes. He has no rales. He exhibits no tenderness.  Abdominal: Soft. Bowel sounds are normal. He exhibits no distension and no mass. There is no tenderness. There is no rebound and no guarding.  Musculoskeletal: Normal range of motion. He exhibits tenderness.  He exhibits no edema.  Lymphadenopathy:    He has no cervical adenopathy.  Neurological: He is alert and oriented to person, place, and time. He has normal reflexes. No cranial nerve deficit. He exhibits normal muscle tone. Coordination normal.  Skin: Skin is warm and dry. No rash noted.  Psychiatric: He has a normal mood and affect. His behavior is normal. Judgment and thought content normal.          Assessment & Plan:

## 2011-01-03 NOTE — Assessment & Plan Note (Signed)
Not doing too bad

## 2011-01-03 NOTE — Assessment & Plan Note (Signed)
On Rx 

## 2011-04-11 ENCOUNTER — Ambulatory Visit: Payer: 59 | Admitting: Internal Medicine

## 2011-04-17 ENCOUNTER — Ambulatory Visit (INDEPENDENT_AMBULATORY_CARE_PROVIDER_SITE_OTHER): Payer: 59 | Admitting: Internal Medicine

## 2011-04-17 ENCOUNTER — Encounter: Payer: Self-pay | Admitting: Internal Medicine

## 2011-04-17 ENCOUNTER — Other Ambulatory Visit (INDEPENDENT_AMBULATORY_CARE_PROVIDER_SITE_OTHER): Payer: 59

## 2011-04-17 VITALS — BP 112/64 | HR 72 | Temp 98.2°F | Resp 16 | Wt 154.0 lb

## 2011-04-17 DIAGNOSIS — E785 Hyperlipidemia, unspecified: Secondary | ICD-10-CM

## 2011-04-17 DIAGNOSIS — K219 Gastro-esophageal reflux disease without esophagitis: Secondary | ICD-10-CM

## 2011-04-17 DIAGNOSIS — R609 Edema, unspecified: Secondary | ICD-10-CM

## 2011-04-17 DIAGNOSIS — L299 Pruritus, unspecified: Secondary | ICD-10-CM

## 2011-04-17 DIAGNOSIS — M1990 Unspecified osteoarthritis, unspecified site: Secondary | ICD-10-CM

## 2011-04-17 DIAGNOSIS — Z23 Encounter for immunization: Secondary | ICD-10-CM

## 2011-04-17 DIAGNOSIS — R972 Elevated prostate specific antigen [PSA]: Secondary | ICD-10-CM

## 2011-04-17 LAB — BASIC METABOLIC PANEL
Calcium: 9.7 mg/dL (ref 8.4–10.5)
GFR: 107.83 mL/min (ref >60.00)
Glucose, Bld: 89 mg/dL (ref 70–99)
Sodium: 140 mEq/L (ref 135–145)

## 2011-04-17 MED ORDER — KETOCONAZOLE 2 % EX CREA: TOPICAL_CREAM | Freq: Two times a day (BID) | CUTANEOUS | Status: DC

## 2011-04-17 MED ORDER — TRIAMCINOLONE ACETONIDE 0.1 % EX CREA: TOPICAL_CREAM | Freq: Two times a day (BID) | CUTANEOUS | Status: DC

## 2011-04-17 MED ORDER — AMOXICILLIN 500 MG PO CAPS: ORAL_CAPSULE | ORAL | Status: DC

## 2011-04-17 NOTE — Assessment & Plan Note (Signed)
Continue with current prescription therapy as reflected on the Med list.  

## 2011-04-17 NOTE — Assessment & Plan Note (Signed)
Cont w/topical rx

## 2011-04-17 NOTE — Assessment & Plan Note (Signed)
Chronic due to venous insuficciency

## 2011-04-17 NOTE — Assessment & Plan Note (Signed)
Watching PSA 

## 2011-04-17 NOTE — Assessment & Plan Note (Signed)
S/p TKR

## 2011-04-17 NOTE — Progress Notes (Signed)
  Subjective:    Patient ID: Nathaniel Harvey, male    DOB: Apr 26, 1934, 77 y.o.   MRN: [id number]  HPI   The patient is here to follow up on chronic GERD, OA, anxiety symptoms controlled with medicines, diet and exercise. Not taking pain meds   Review of Systems  Constitutional: Negative for appetite change, fatigue and unexpected weight change.  HENT: Negative for nosebleeds, congestion, sore throat, sneezing, trouble swallowing and neck pain.   Eyes: Negative for itching and visual disturbance.  Respiratory: Negative for cough.   Cardiovascular: Negative for chest pain, palpitations and leg swelling.  Gastrointestinal: Negative for nausea, diarrhea, blood in stool and abdominal distention.  Genitourinary: Negative for frequency and hematuria.  Musculoskeletal: Positive for gait problem (better). Negative for back pain and joint swelling.  Skin: Negative for rash.  Neurological: Negative for dizziness, tremors, speech difficulty and weakness.  Psychiatric/Behavioral: Negative for sleep disturbance, dysphoric mood and agitation. The patient is not nervous/anxious.        Objective:   Physical Exam  Constitutional: He is oriented to person, place, and time. He appears well-developed and well-nourished. No distress.  HENT:  Mouth/Throat: Oropharynx is clear and moist.  Eyes: Conjunctivae are normal. Pupils are equal, round, and reactive to light.  Neck: Normal range of motion. No JVD present. No thyromegaly present.  Cardiovascular: Normal rate, regular rhythm, normal heart sounds and intact distal pulses.  Exam reveals no gallop and no friction rub.   No murmur heard. Pulmonary/Chest: Effort normal and breath sounds normal. No respiratory distress. He has no wheezes. He has no rales. He exhibits no tenderness.  Abdominal: Soft. Bowel sounds are normal. He exhibits no distension and no mass. There is no tenderness. There is no rebound and no guarding.  Musculoskeletal: Normal range of  motion. He exhibits no edema and no tenderness (R post-op knee).  Lymphadenopathy:    He has no cervical adenopathy.  Neurological: He is alert and oriented to person, place, and time. He has normal reflexes. No cranial nerve deficit. He exhibits normal muscle tone. Coordination normal.  Skin: Skin is warm and dry. No rash noted.  Psychiatric: He has a normal mood and affect. His behavior is normal. Judgment and thought content normal.          Assessment & Plan:

## 2011-06-19 ENCOUNTER — Ambulatory Visit (INDEPENDENT_AMBULATORY_CARE_PROVIDER_SITE_OTHER): Payer: 59 | Admitting: Internal Medicine

## 2011-06-19 ENCOUNTER — Other Ambulatory Visit (INDEPENDENT_AMBULATORY_CARE_PROVIDER_SITE_OTHER): Payer: 59

## 2011-06-19 ENCOUNTER — Encounter: Payer: Self-pay | Admitting: Internal Medicine

## 2011-06-19 VITALS — BP 124/76 | HR 60 | Temp 98.3°F | Wt 159.0 lb

## 2011-06-19 DIAGNOSIS — Z2911 Encounter for prophylactic immunotherapy for respiratory syncytial virus (RSV): Secondary | ICD-10-CM

## 2011-06-19 DIAGNOSIS — I872 Venous insufficiency (chronic) (peripheral): Secondary | ICD-10-CM

## 2011-06-19 DIAGNOSIS — K219 Gastro-esophageal reflux disease without esophagitis: Secondary | ICD-10-CM

## 2011-06-19 DIAGNOSIS — M25569 Pain in unspecified knee: Secondary | ICD-10-CM

## 2011-06-19 DIAGNOSIS — Z23 Encounter for immunization: Secondary | ICD-10-CM

## 2011-06-19 DIAGNOSIS — E785 Hyperlipidemia, unspecified: Secondary | ICD-10-CM

## 2011-06-19 DIAGNOSIS — R972 Elevated prostate specific antigen [PSA]: Secondary | ICD-10-CM

## 2011-06-19 DIAGNOSIS — K649 Unspecified hemorrhoids: Secondary | ICD-10-CM

## 2011-06-19 LAB — COMPREHENSIVE METABOLIC PANEL
ALT: 26 U/L (ref 0–53)
AST: 33 U/L (ref 0–37)
Alkaline Phosphatase: 65 U/L (ref 39–117)
Calcium: 9.2 mg/dL (ref 8.4–10.5)
Chloride: 104 mEq/L (ref 96–112)
Creatinine, Ser: 0.9 mg/dL (ref 0.4–1.5)
Total Bilirubin: 1 mg/dL (ref 0.3–1.2)

## 2011-06-19 LAB — LIPID PANEL
HDL: 82.6 mg/dL (ref >39.00)
LDL Cholesterol: 99 mg/dL (ref 0–99)
Total CHOL/HDL Ratio: 2
VLDL: 6.8 mg/dL (ref 0.0–40.0)

## 2011-06-19 LAB — TSH: TSH: 1.97 u[IU]/mL (ref 0.35–5.50)

## 2011-06-19 MED ORDER — ROSUVASTATIN CALCIUM 40 MG PO TABS: 40.0000 mg | ORAL_TABLET | Freq: Every day | ORAL | Status: DC

## 2011-06-19 NOTE — Assessment & Plan Note (Signed)
Continue with current prescription therapy as reflected on the Med list.  

## 2011-06-19 NOTE — Assessment & Plan Note (Signed)
Chronic. Worse 11/12. Surg consult Continue with current prescription therapy as reflected on the Med list.

## 2011-06-19 NOTE — Progress Notes (Signed)
  Subjective:    Patient ID: Nathaniel Harvey, male    DOB: November 04, 1933, 77 y.o.   MRN: [id number]  HPI   The patient is here to follow up on chronic hemorrhoids, dyslipidemia and chronic moderate OA symptoms controlled with medicines, diet and exercise.   Review of Systems  Constitutional: Negative for appetite change, fatigue and unexpected weight change.  HENT: Negative for nosebleeds, congestion, sore throat, sneezing, trouble swallowing and neck pain.   Eyes: Negative for itching and visual disturbance.  Respiratory: Negative for cough.   Cardiovascular: Negative for chest pain, palpitations and leg swelling.  Gastrointestinal: Positive for anal bleeding and rectal pain. Negative for nausea, diarrhea, blood in stool and abdominal distention.  Genitourinary: Negative for frequency and hematuria.  Musculoskeletal: Positive for arthralgias. Negative for back pain, joint swelling and gait problem.  Skin: Negative for rash.  Neurological: Negative for dizziness, tremors, speech difficulty and weakness.  Psychiatric/Behavioral: Negative for sleep disturbance, dysphoric mood and agitation. The patient is not nervous/anxious.        Objective:   Physical Exam  Constitutional: He is oriented to person, place, and time. He appears well-developed.  HENT:  Mouth/Throat: Oropharynx is clear and moist.  Eyes: Conjunctivae are normal. Pupils are equal, round, and reactive to light.  Neck: Normal range of motion. No JVD present. No thyromegaly present.  Cardiovascular: Normal rate, regular rhythm, normal heart sounds and intact distal pulses.  Exam reveals no gallop and no friction rub.   No murmur heard. Pulmonary/Chest: Effort normal and breath sounds normal. No respiratory distress. He has no wheezes. He has no rales. He exhibits no tenderness.  Abdominal: Soft. Bowel sounds are normal. He exhibits no distension and no mass. There is no tenderness. There is no rebound and no guarding.    Genitourinary:       declined  Musculoskeletal: Normal range of motion. He exhibits no edema and no tenderness.  Lymphadenopathy:    He has no cervical adenopathy.  Neurological: He is alert and oriented to person, place, and time. He has normal reflexes. No cranial nerve deficit. He exhibits normal muscle tone. Coordination normal.  Skin: Skin is warm and dry. No rash noted.  Psychiatric: He has a normal mood and affect. His behavior is normal. Judgment and thought content normal.          Assessment & Plan:

## 2011-06-19 NOTE — Assessment & Plan Note (Signed)
Urol f/u 

## 2011-06-19 NOTE — Assessment & Plan Note (Signed)
R>>L knee R TKR 2012 Better after  surgery

## 2011-06-21 ENCOUNTER — Other Ambulatory Visit: Payer: Self-pay | Admitting: Internal Medicine

## 2011-06-21 MED ORDER — VITAMIN B-12 1000 MCG SL SUBL: 1.0000 | SUBLINGUAL_TABLET | Freq: Every day | SUBLINGUAL | Status: DC

## 2011-07-03 ENCOUNTER — Ambulatory Visit (INDEPENDENT_AMBULATORY_CARE_PROVIDER_SITE_OTHER): Payer: 59 | Admitting: Surgery

## 2011-07-20 ENCOUNTER — Ambulatory Visit (INDEPENDENT_AMBULATORY_CARE_PROVIDER_SITE_OTHER): Payer: 59 | Admitting: Surgery

## 2011-07-20 ENCOUNTER — Encounter (INDEPENDENT_AMBULATORY_CARE_PROVIDER_SITE_OTHER): Payer: Self-pay | Admitting: Surgery

## 2011-07-20 VITALS — BP 138/86 | HR 60 | Temp 97.6°F | Resp 18 | Ht 65.0 in | Wt 163.1 lb

## 2011-07-20 DIAGNOSIS — K6289 Other specified diseases of anus and rectum: Secondary | ICD-10-CM

## 2011-07-20 DIAGNOSIS — K649 Unspecified hemorrhoids: Secondary | ICD-10-CM

## 2011-07-20 MED ORDER — HYDROCORTISONE ACE-PRAMOXINE 1-1 % RE FOAM: 1.0000 | Freq: Two times a day (BID) | RECTAL | Status: AC

## 2011-07-20 NOTE — Patient Instructions (Signed)
Proctofoam HC this is a prescription the pharmacy will have.  Use as directed on label. Miralax - over the counter.  Take this twice a day with breakfast and dinner. Continue stool softner. Citrucel fiber supplement.  2 tablespoons in 8 ounces of water daily. Warm tub soaks twice daily. Tucks medicated wipes for bowel movements.  Do not use regular toilet paper.   Do not sit for more  Than 5 minutes on commode. Return to clinic in 3 weeks.

## 2011-07-20 NOTE — Progress Notes (Signed)
Patient ID: Nathaniel Harvey, male   DOB: 06/28/1934, 77 y.o.   MRN: [id number]  Chief Complaint  Patient presents with  . New Evaluation    eval of hems    HPI Nathaniel Harvey is a 77 y.o. male. HPI Patient presents today at the request of Dr. Cheron Schaumann due to rectal pain. This went on for a number of years. It contunues. He has rectal pain, burning and itching around his anus. He has very little bleeding. He sits one hour at a time to have a bowel movement.  Past Medical History  Diagnosis Date  . GERD (gastroesophageal reflux disease)   . Osteoarthritis   . PVD (peripheral vascular disease)   . Hemorrhoids   . Elevated PSA 2008  . Hyperlipidemia   . Knee pain     bilateral L>R    Past Surgical History  Procedure Date  . Vagotomy 12-98  . Nissen fundoplication modification   . Foot fracture surgery     right  . Knee torn ligament repair 1991    right  . Lipoma removal on back  2010  . Hernia repair   . Joint replacement 2012    R knee    Family History  Problem Relation Age of Onset  . Heart disease Father 64    CAD  . Heart disease Mother     Social History History  Substance Use Topics  . Smoking status: Former Games developer  . Smokeless tobacco: Never Used  . Alcohol Use: No    Allergies  Allergen Reactions  . Atorvastatin     REACTION: intolerance  . Meloxicam     REACTION: edema    Current Outpatient Prescriptions  Medication Sig Dispense Refill  . amoxicillin (AMOXIL) 500 MG capsule 2 g 1 h prior to dental work  20 capsule  1  . Cholecalciferol (EQL VITAMIN D3) 1000 UNITS tablet Take 1,000 Units by mouth daily.        . hydrocortisone-pramoxine (PROCTOFOAM HC) rectal foam Place 1 applicator rectally 2 (two) times daily.  10 g  2  . ketoconazole (NIZORAL) 2 % cream Apply topically 2 (two) times daily.  120 g  11  . multivitamin-lutein (OCUVITE-LUTEIN) CAPS Take 1 capsule by mouth daily.        . ranitidine (ZANTAC) 300 MG tablet Take 300 mg by mouth at  bedtime.        . rosuvastatin (CRESTOR) 40 MG tablet Take 1 tablet (40 mg total) by mouth daily.  30 tablet  11  . senna-docusate (STOOL SOFTENER PLUS LAXATIVE) 8.6-50 MG per tablet Take 2 tablets by mouth 2 (two) times daily.        Marland Kitchen triamcinolone (KENALOG) 0.1 % cream Apply topically 2 (two) times daily.  120 g  11    Review of Systems Review of Systems  Constitutional: Negative for fever, chills and unexpected weight change.  HENT: Negative for hearing loss, congestion, sore throat, trouble swallowing and voice change.   Eyes: Negative for visual disturbance.  Respiratory: Negative for cough and wheezing.   Cardiovascular: Negative for chest pain, palpitations and leg swelling.  Gastrointestinal: Positive for constipation and anal bleeding. Negative for nausea, vomiting, abdominal pain, diarrhea, blood in stool, abdominal distention and rectal pain.  Genitourinary: Negative for hematuria and difficulty urinating.  Musculoskeletal: Negative for arthralgias.  Skin: Negative for rash and wound.  Neurological: Negative for seizures, syncope, weakness and headaches.  Hematological: Negative for adenopathy. Does not bruise/bleed easily.  Psychiatric/Behavioral:  Negative for confusion.    Blood pressure 138/86, pulse 60, temperature 97.6 F (36.4 C), temperature source Temporal, resp. rate 18, height 5\' 5"  (1.651 m), weight 163 lb 2 oz (73.993 kg).  Physical Exam Physical Exam  Constitutional: He appears well-developed and well-nourished.  HENT:  Head: Normocephalic and atraumatic.  Eyes: EOM are normal. Pupils are equal, round, and reactive to light.  Neck: Normal range of motion. Neck supple.  Pulmonary/Chest: Effort normal and breath sounds normal.  Abdominal: Soft.  Genitourinary:       Significant excoriation of anal skin and surrounding skin on both proximal. No external hemorrhoids noted. Significant thinning of perianal skin with inflammation. Normal tone. Large prostate.  Endoscopy performed. Mild grade 2 internal hemorrhoids noted without prolapse or bleeding.    Data Reviewed   Assessment    Pruritis ani Grade 2 internal hemorrhoids 3 columns    Plan   He has severe pruritus ani . He has been applying topical steroids to the anal canal and this is exacerbating his problem. He also has problems with constipation and significant straining with his bowel movements. I recommend a high-fiber diet, ProctoFoam-HC, MiraLax and warm tub soaks. I recommend spending no more than 5 minutes on the commode at a time. I've asked him to stop applying the other topical ointments       Nathaniel Whiteman A. 07/20/2011, 2:08 PM

## 2011-08-14 ENCOUNTER — Ambulatory Visit (INDEPENDENT_AMBULATORY_CARE_PROVIDER_SITE_OTHER): Payer: Medicare Other | Admitting: Surgery

## 2011-08-14 ENCOUNTER — Encounter (INDEPENDENT_AMBULATORY_CARE_PROVIDER_SITE_OTHER): Payer: Self-pay | Admitting: Surgery

## 2011-08-14 VITALS — BP 102/68 | HR 76 | Temp 97.4°F | Resp 20 | Ht 64.0 in | Wt 160.8 lb

## 2011-08-14 DIAGNOSIS — K648 Other hemorrhoids: Secondary | ICD-10-CM

## 2011-08-14 NOTE — Patient Instructions (Signed)
Stop miralax citrucel twice a day. Stool softner twice a day.

## 2011-08-14 NOTE — Progress Notes (Signed)
Patient ID: Nathaniel Harvey, male   DOB: [birth year], 78 y.o.   MRN: [id number] The patient returns in a further internal hemorrhoids and itching. She was placed on medical management and his symptoms have improved. He is taking a fiber supplement, increasing his water intake and using a stool softener. He has less burning and itching.  Exam: Anal canal shows less irritation. There is a reduction in the size of hemorrhoid tissue involving all 3 columns.  Impression: Internal hemorrhoids  Plan: Continue medical management since he is improved on this. Followup 3 weeks.

## 2011-09-08 ENCOUNTER — Encounter (INDEPENDENT_AMBULATORY_CARE_PROVIDER_SITE_OTHER): Payer: Self-pay | Admitting: Surgery

## 2011-09-08 ENCOUNTER — Ambulatory Visit (INDEPENDENT_AMBULATORY_CARE_PROVIDER_SITE_OTHER): Payer: Medicare Other | Admitting: Surgery

## 2011-09-08 DIAGNOSIS — K649 Unspecified hemorrhoids: Secondary | ICD-10-CM

## 2011-09-08 NOTE — Progress Notes (Signed)
Patient ID: Nathaniel Harvey, male   DOB: May 21, 1934, 78 y.o.   MRN: [id number] The patient returns in a further internal hemorrhoids and itching. She was placed on medical management and his symptoms have improved. He is taking a fiber supplement, increasing his water intake and using a stool softener. He has less burning and itching.  Exam: Anal canal shows less irritation. There is a reduction in the size of hemorrhoid tissue involving all 3 columns.  Impression: Internal hemorrhoids  Plan: Continue medical management since he is improved on this. Followup 3 months.

## 2011-09-08 NOTE — Patient Instructions (Signed)
Follow up three months

## 2011-09-18 ENCOUNTER — Ambulatory Visit (INDEPENDENT_AMBULATORY_CARE_PROVIDER_SITE_OTHER): Payer: Medicaid Other | Admitting: Internal Medicine

## 2011-09-18 ENCOUNTER — Encounter: Payer: Self-pay | Admitting: Internal Medicine

## 2011-09-18 DIAGNOSIS — L258 Unspecified contact dermatitis due to other agents: Secondary | ICD-10-CM

## 2011-09-18 DIAGNOSIS — K649 Unspecified hemorrhoids: Secondary | ICD-10-CM

## 2011-09-18 DIAGNOSIS — L853 Xerosis cutis: Secondary | ICD-10-CM | POA: Insufficient documentation

## 2011-09-18 DIAGNOSIS — L290 Pruritus ani: Secondary | ICD-10-CM

## 2011-09-18 DIAGNOSIS — E785 Hyperlipidemia, unspecified: Secondary | ICD-10-CM

## 2011-09-18 DIAGNOSIS — R972 Elevated prostate specific antigen [PSA]: Secondary | ICD-10-CM

## 2011-09-18 MED ORDER — METHYLPREDNISOLONE ACETATE PF 80 MG/ML IJ SUSP
120.0000 mg | Freq: Once | INTRAMUSCULAR | Status: AC
  Administered 2011-09-18: 120 mg via INTRAMUSCULAR

## 2011-09-18 MED ORDER — RANITIDINE HCL 300 MG PO TABS: 300.0000 mg | ORAL_TABLET | Freq: Every day | ORAL | Status: DC

## 2011-09-18 NOTE — Assessment & Plan Note (Signed)
Continue with current prescription therapy as reflected on the Med list.  

## 2011-09-18 NOTE — Progress Notes (Signed)
Patient ID: Nathaniel Harvey, male   DOB: 1933/12/31, 78 y.o.   MRN: [id number]  Subjective:    Patient ID: Nathaniel Harvey, male    DOB: 11-Dec-1933, 78 y.o.   MRN: [id number]  HPI   The patient is here to follow up on chronic hemorrhoids, dyslipidemia and chronic moderate OA symptoms controlled with medicines some. C/o itching on back x mo   Review of Systems  Constitutional: Negative for appetite change, fatigue and unexpected weight change.  HENT: Negative for nosebleeds, congestion, sore throat, sneezing, trouble swallowing and neck pain.   Eyes: Negative for itching and visual disturbance.  Respiratory: Negative for cough.   Cardiovascular: Negative for chest pain, palpitations and leg swelling.  Gastrointestinal: Positive for anal bleeding and rectal pain. Negative for nausea, diarrhea, blood in stool and abdominal distention.  Genitourinary: Negative for frequency and hematuria.  Musculoskeletal: Positive for arthralgias. Negative for back pain, joint swelling and gait problem.  Skin: Negative for rash.  Neurological: Negative for dizziness, tremors, speech difficulty and weakness.  Psychiatric/Behavioral: Negative for sleep disturbance, dysphoric mood and agitation. The patient is not nervous/anxious.        Objective:   Physical Exam  Constitutional: He is oriented to person, place, and time. He appears well-developed.  HENT:  Mouth/Throat: Oropharynx is clear and moist.  Eyes: Conjunctivae are normal. Pupils are equal, round, and reactive to light.  Neck: Normal range of motion. No JVD present. No thyromegaly present.  Cardiovascular: Normal rate, regular rhythm, normal heart sounds and intact distal pulses.  Exam reveals no gallop and no friction rub.   No murmur heard. Pulmonary/Chest: Effort normal and breath sounds normal. No respiratory distress. He has no wheezes. He has no rales. He exhibits no tenderness.  Abdominal: Soft. Bowel sounds are normal. He exhibits no  distension and no mass. There is no tenderness. There is no rebound and no guarding.  Genitourinary:       declined  Musculoskeletal: Normal range of motion. He exhibits no edema and no tenderness.  Lymphadenopathy:    He has no cervical adenopathy.  Neurological: He is alert and oriented to person, place, and time. He has normal reflexes. No cranial nerve deficit. He exhibits normal muscle tone. Coordination normal.  Skin: Skin is warm and dry. No rash noted.  Psychiatric: He has a normal mood and affect. His behavior is normal. Judgment and thought content normal.  No rash        Assessment & Plan:   Refused labs

## 2011-09-18 NOTE — Assessment & Plan Note (Signed)
Watching  

## 2011-09-18 NOTE — Assessment & Plan Note (Signed)
Depomedrol 120 mg im Triamc in Eucerin 1:5 bid

## 2011-09-18 NOTE — Assessment & Plan Note (Signed)
Better  

## 2011-12-14 ENCOUNTER — Encounter (INDEPENDENT_AMBULATORY_CARE_PROVIDER_SITE_OTHER): Payer: Self-pay | Admitting: Surgery

## 2011-12-14 ENCOUNTER — Ambulatory Visit (INDEPENDENT_AMBULATORY_CARE_PROVIDER_SITE_OTHER): Payer: 59 | Admitting: Surgery

## 2011-12-14 VITALS — BP 102/70 | HR 88 | Temp 98.0°F | Resp 16 | Ht 65.0 in | Wt 150.0 lb

## 2011-12-14 DIAGNOSIS — K649 Unspecified hemorrhoids: Secondary | ICD-10-CM

## 2011-12-14 NOTE — Patient Instructions (Signed)
Return 3 months.  Use the cream once a day.  Keep area dry.

## 2011-12-14 NOTE — Progress Notes (Signed)
Subjective:     Patient ID: Nathaniel Harvey, male   DOB: March 08, 1934, 78 y.o.   MRN: [id number]  HPI   Patient returns for followup of his internal hemorrhoid disease and pruritus ani.  His symptoms wax and wane but overall he seems to be okay. He has minimal rectal bleeding, itching or pain. The anal skin itches from time to time.   Review of Systems  Constitutional: Negative.   HENT: Negative.   Gastrointestinal: Negative for anal bleeding and rectal pain.  Genitourinary: Negative.   Musculoskeletal: Negative.        Objective:   Physical Exam  Constitutional: He is oriented to person, place, and time. He appears well-developed and well-nourished.  HENT:  Head: Normocephalic and atraumatic.  Neck: Normal range of motion. Neck supple.  Genitourinary: Rectum normal.       Anoscopy performed grade 1 internal hemorrhoids involving all 3 columns noted without bleeding or prolapse. Anal canal normal. Prostate enlarged. No suspicious masses noted  Neurological: He is alert and oriented to person, place, and time.  Skin: Skin is warm and dry.  Psychiatric: He has a normal mood and affect. His behavior is normal. Judgment and thought content normal.       Assessment:     Grade 1 internal hemorrhoids stable Pruritis ani    Plan:     Return in 3 months.  No change in care plan.

## 2012-01-09 ENCOUNTER — Encounter (INDEPENDENT_AMBULATORY_CARE_PROVIDER_SITE_OTHER): Payer: Self-pay | Admitting: Surgery

## 2012-01-23 ENCOUNTER — Ambulatory Visit (INDEPENDENT_AMBULATORY_CARE_PROVIDER_SITE_OTHER): Payer: 59 | Admitting: Internal Medicine

## 2012-01-23 ENCOUNTER — Encounter: Payer: Self-pay | Admitting: Internal Medicine

## 2012-01-23 VITALS — BP 120/68 | HR 68 | Temp 97.7°F | Resp 16 | Wt 158.0 lb

## 2012-01-23 DIAGNOSIS — R972 Elevated prostate specific antigen [PSA]: Secondary | ICD-10-CM

## 2012-01-23 DIAGNOSIS — L290 Pruritus ani: Secondary | ICD-10-CM

## 2012-01-23 DIAGNOSIS — L258 Unspecified contact dermatitis due to other agents: Secondary | ICD-10-CM

## 2012-01-23 DIAGNOSIS — L853 Xerosis cutis: Secondary | ICD-10-CM

## 2012-01-23 DIAGNOSIS — E785 Hyperlipidemia, unspecified: Secondary | ICD-10-CM

## 2012-01-23 DIAGNOSIS — K649 Unspecified hemorrhoids: Secondary | ICD-10-CM

## 2012-01-23 NOTE — Progress Notes (Signed)
   Subjective:    Patient ID: Nathaniel Harvey, male    DOB: [birth year], 77 y.o.   MRN: [id number]  HPI   The patient is here to follow up on chronic hemorrhoids, dyslipidemia and chronic moderate OA symptoms controlled with medicines some. C/o itching on back x mo   Review of Systems  Constitutional: Negative for appetite change, fatigue and unexpected weight change.  HENT: Negative for nosebleeds, congestion, sore throat, sneezing, trouble swallowing and neck pain.   Eyes: Negative for itching and visual disturbance.  Respiratory: Negative for cough.   Cardiovascular: Negative for chest pain, palpitations and leg swelling.  Gastrointestinal: Positive for anal bleeding and rectal pain. Negative for nausea, diarrhea, blood in stool and abdominal distention.  Genitourinary: Negative for frequency and hematuria.  Musculoskeletal: Positive for arthralgias. Negative for back pain, joint swelling and gait problem.  Skin: Negative for rash.  Neurological: Negative for dizziness, tremors, speech difficulty and weakness.  Psychiatric/Behavioral: Negative for disturbed wake/sleep cycle, dysphoric mood and agitation. The patient is not nervous/anxious.        Objective:   Physical Exam  Constitutional: He is oriented to person, place, and time. He appears well-developed.  HENT:  Mouth/Throat: Oropharynx is clear and moist.  Eyes: Conjunctivae are normal. Pupils are equal, round, and reactive to light.  Neck: Normal range of motion. No JVD present. No thyromegaly present.  Cardiovascular: Normal rate, regular rhythm, normal heart sounds and intact distal pulses.  Exam reveals no gallop and no friction rub.   No murmur heard. Pulmonary/Chest: Effort normal and breath sounds normal. No respiratory distress. He has no wheezes. He has no rales. He exhibits no tenderness.  Abdominal: Soft. Bowel sounds are normal. He exhibits no distension and no mass. There is no tenderness. There is no rebound and  no guarding.  Genitourinary:       declined  Musculoskeletal: Normal range of motion. He exhibits no edema and no tenderness.  Lymphadenopathy:    He has no cervical adenopathy.  Neurological: He is alert and oriented to person, place, and time. He has normal reflexes. No cranial nerve deficit. He exhibits normal muscle tone. Coordination normal.  Skin: Skin is warm and dry. No rash noted.  Psychiatric: He has a normal mood and affect. His behavior is normal. Judgment and thought content normal.  No rash Scoliosis is present   Lab Results  Component Value Date   WBC 8.3 08/25/2010   HGB 8.5* 08/25/2010   HCT 25.2* 08/25/2010   PLT 146* 08/25/2010   GLUCOSE 82 06/19/2011   CHOL 188 06/19/2011   TRIG 34.0 06/19/2011   HDL 82.60 06/19/2011   LDLDIRECT 134.3 06/23/2009   LDLCALC 99 06/19/2011   ALT 26 06/19/2011   AST 33 06/19/2011   NA 139 06/19/2011   K 4.1 06/19/2011   CL 104 06/19/2011   CREATININE 0.9 06/19/2011   BUN 20 06/19/2011   CO2 28 06/19/2011   TSH 1.97 06/19/2011   PSA 6.02* 02/25/2010   INR 1.14 08/16/2010   HGBA1C 5.8 02/12/2008        Assessment & Plan:   Refused labs today

## 2012-01-23 NOTE — Assessment & Plan Note (Signed)
Cont Kenalog prn

## 2012-01-23 NOTE — Assessment & Plan Note (Signed)
Continue with current prescription therapy as reflected on the Med list.  

## 2012-01-23 NOTE — Assessment & Plan Note (Signed)
Better Continue with current prescription therapy as reflected on the Med list.  

## 2012-02-13 IMAGING — CR DG CHEST 2V
2 series · 2 of 2 positions shown · non-contrast
Comparison: None.

CLINICAL DATA: Preoperative respiratory exam.  Osteoarthritis of
the right knee.

CHEST - 2 VIEW

[view not recorded (1 of 2)]
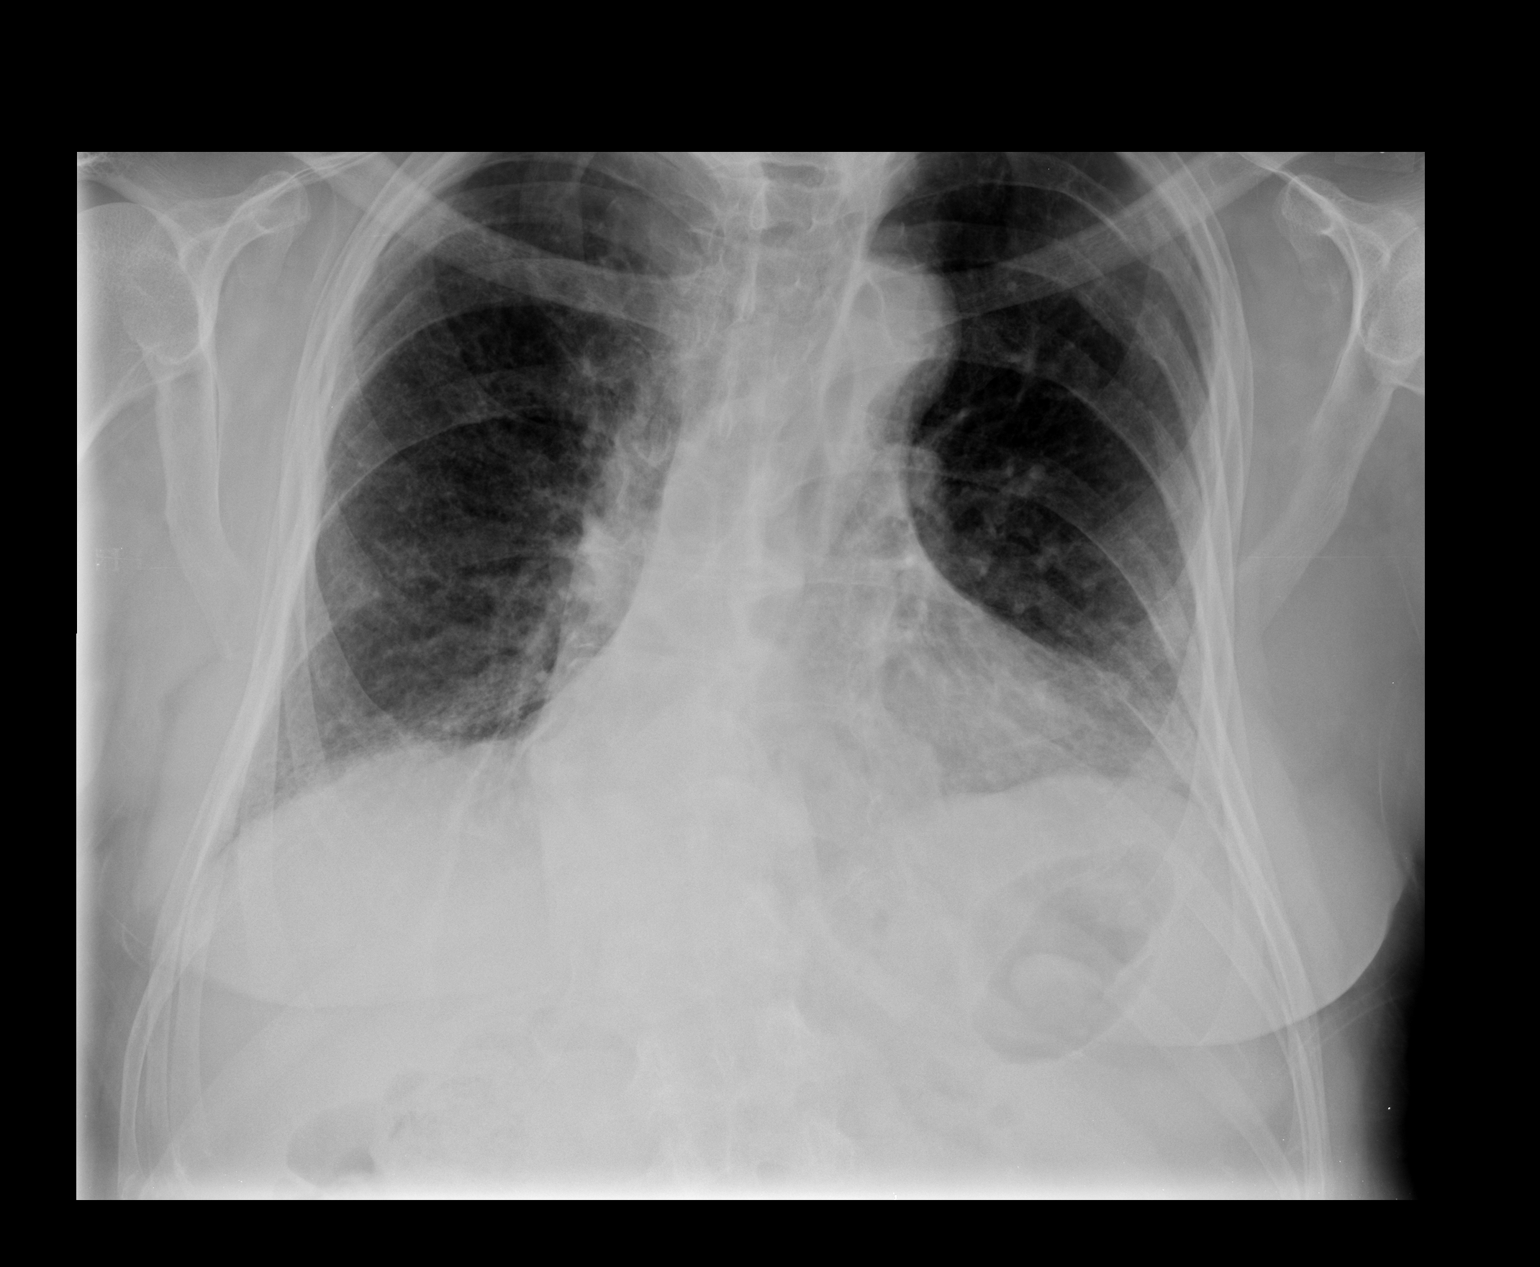

[view not recorded (2 of 2)]
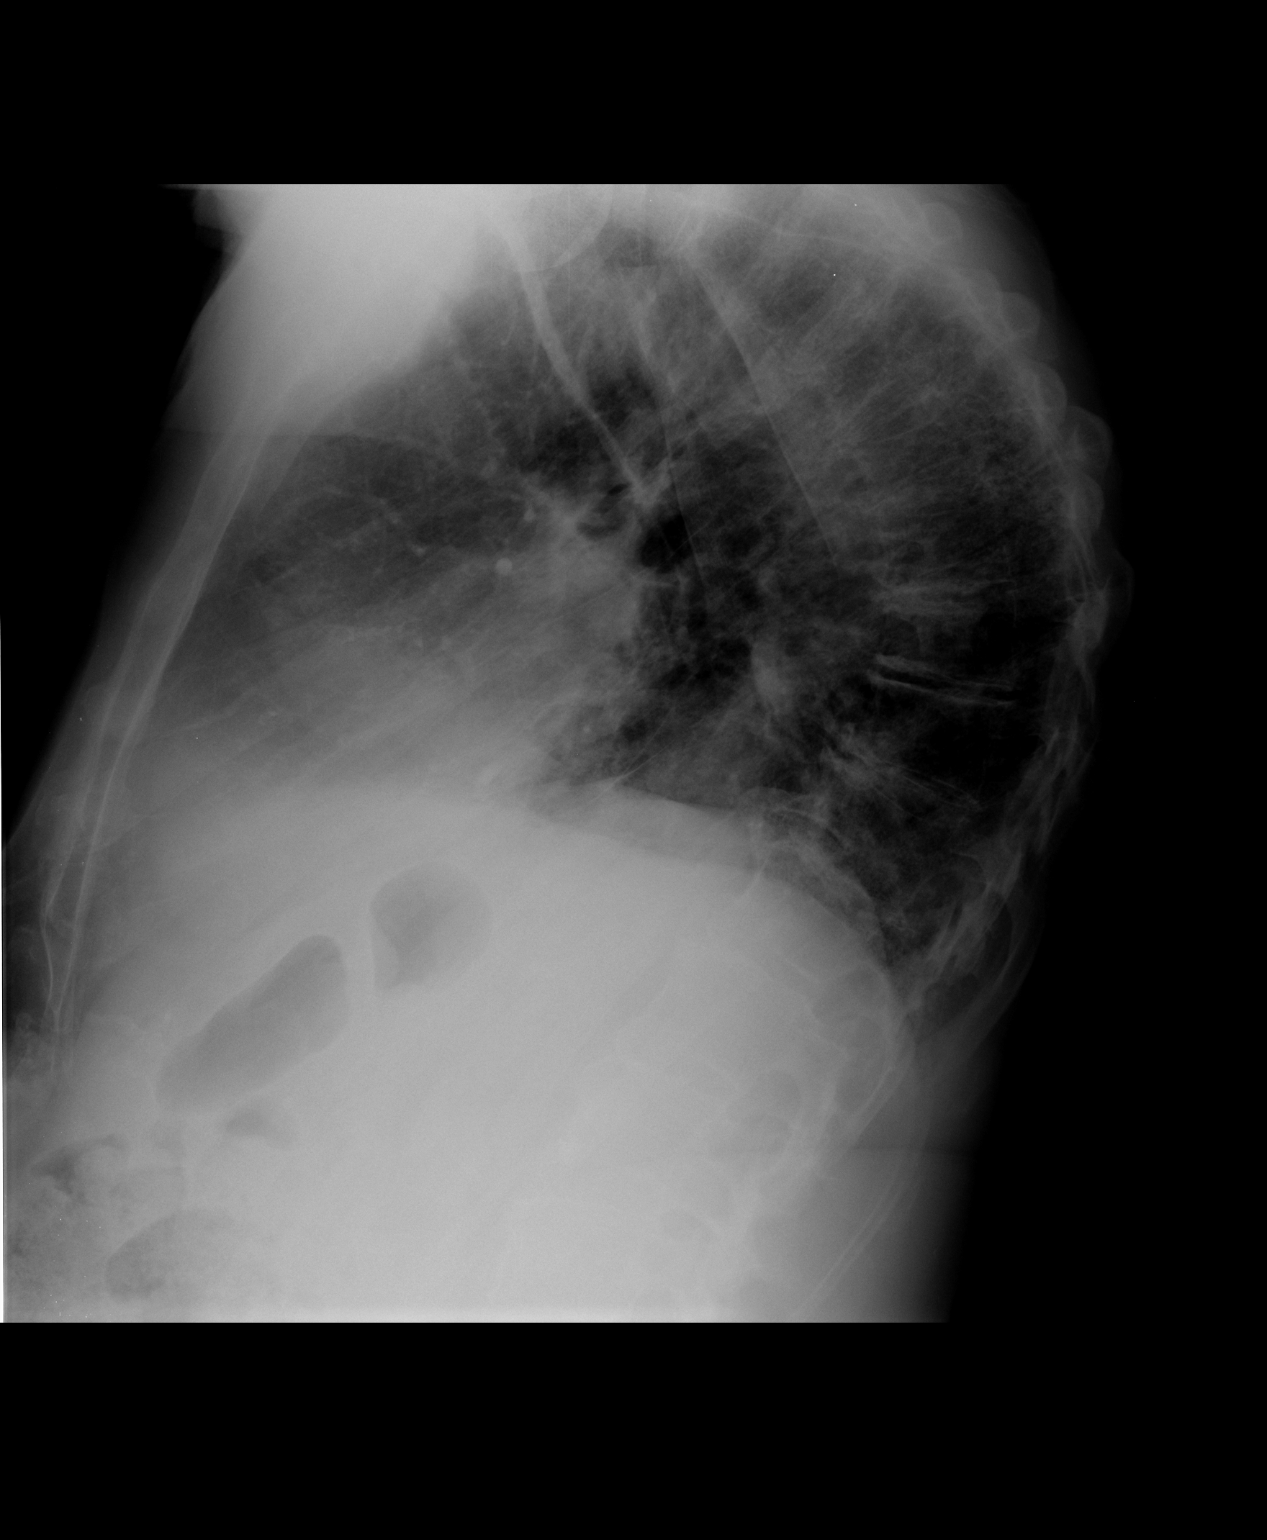

[2 of 2 positions shown; findings below may reference images not displayed]

FINDINGS: The heart size and pulmonary vascularity are normal and
the lungs are clear.  There is a large hiatal hernia.  There is
accentuation of the thoracic kyphosis.  Old left posterior lateral
rib fractures are noted.
IMPRESSION: No acute disease.  Large hiatal hernia.

## 2012-03-14 ENCOUNTER — Encounter (INDEPENDENT_AMBULATORY_CARE_PROVIDER_SITE_OTHER): Payer: Self-pay | Admitting: Surgery

## 2012-03-14 ENCOUNTER — Ambulatory Visit (INDEPENDENT_AMBULATORY_CARE_PROVIDER_SITE_OTHER): Payer: Medicare Other | Admitting: Surgery

## 2012-03-14 VITALS — BP 116/72 | HR 70 | Temp 98.0°F | Ht 65.0 in | Wt 155.6 lb

## 2012-03-14 DIAGNOSIS — K602 Anal fissure, unspecified: Secondary | ICD-10-CM | POA: Insufficient documentation

## 2012-03-14 NOTE — Patient Instructions (Signed)
Anal Fissure, Adult An anal fissure is a small tear or crack in the skin around the anus. Bleeding from a fissure usually stops on its own within a few minutes. However, bleeding will often reoccur with each bowel movement until the crack heals.  CAUSES   Passing large, hard stools.   Frequent diarrheal stools.   Constipation.   Inflammatory bowel disease (Crohn's disease or ulcerative colitis).   Infections.   Anal sex.  SYMPTOMS   Small amounts of blood seen on your stools, on toilet paper, or in the toilet after a bowel movement.   Rectal bleeding.   Painful bowel movements.   Itching or irritation around the anus.  DIAGNOSIS Your caregiver will examine the anal area. An anal fissure can usually be seen with careful inspection. A rectal exam may be performed and a short tube (anoscope) may be used to examine the anal canal. TREATMENT   You may be instructed to take fiber supplements. These supplements can soften your stool to help make bowel movements easier.   Sitz baths may be recommended to help heal the tear. Do not use soap in the sitz baths.   A medicated cream or ointment may be prescribed to lessen discomfort.  HOME CARE INSTRUCTIONS   Maintain a diet high in fruits, whole grains, and vegetables. Avoid constipating foods like bananas and dairy products.   Take sitz baths as directed by your caregiver.   Drink enough fluids to keep your urine clear or pale yellow.   Only take over-the-counter or prescription medicines for pain, discomfort, or fever as directed by your caregiver. Do not take aspirin as this may increase bleeding.   Do not use ointments containing numbing medications (anesthetics) or hydrocortisone. They could slow healing.  SEEK MEDICAL CARE IF:   Your fissure is not completely healed within 3 days.   You have further bleeding.   You have a fever.   You have diarrhea mixed with blood.   You have pain.   Your problem is getting worse  rather than better.  MAKE SURE YOU:   Understand these instructions.   Will watch your condition.   Will get help right away if you are not doing well or get worse.  Document Released: 07/17/2005 Document Revised: 07/06/2011 Document Reviewed: 01/01/2011 [employer] Patient Information 2012 [employer]. 

## 2012-03-14 NOTE — Progress Notes (Signed)
Subjective:     Patient ID: Nathaniel Harvey, male   DOB: [birth year], 78 y.o.   MRN: [id number]  HPI   Patient returns for followup of his internal hemorrhoid disease and pruritus ani.  His symptoms wax and wane but today he is having pain after defacation.  He has minimal rectal bleeding, itching. The anal skin itches from time to time.   Review of Systems  Constitutional: Negative.   HENT: Negative.   Gastrointestinal: Negative for anal bleeding and rectal pain.  Genitourinary: Negative.   Musculoskeletal: Negative.        Objective:   Physical Exam  Constitutional: He is oriented to person, place, and time. He appears well-developed and well-nourished.  HENT:  Head: Normocephalic and atraumatic.  Neck: Normal range of motion. Neck supple.  Genitourinary: Rectum normal.       Anoscopy performed grade 1 internal hemorrhoids involving all 3 columns noted without bleeding or prolapse.posterior midline anal fissure. Prostate enlarged. No suspicious masses noted  Neurological: He is alert and oriented to person, place, and time.  Skin: Skin is warm and dry.  Psychiatric: He has a normal mood and affect. His behavior is normal. Judgment and thought content normal.       Assessment:     Grade 1 internal hemorrhoids stable Anal fissure Pruritis ani    Plan:     Return in  1 months.  Stop using steroid ointments.  Diltiazem gel  Every 6 hours.  High fiber diet.

## 2012-04-02 ENCOUNTER — Telehealth (INDEPENDENT_AMBULATORY_CARE_PROVIDER_SITE_OTHER): Payer: Self-pay

## 2012-04-02 NOTE — Telephone Encounter (Signed)
ok 

## 2012-04-02 NOTE — Telephone Encounter (Signed)
Pt walked in to office wanting a refill on the Diltiziam 2%gel phoned to [facility]. Pls call pt when ready.

## 2012-04-03 ENCOUNTER — Telehealth (INDEPENDENT_AMBULATORY_CARE_PROVIDER_SITE_OTHER): Payer: Self-pay

## 2012-04-03 NOTE — Telephone Encounter (Signed)
I  Left message for patient to call me back. I was calling to let him know I faxed his refill for diltiazem cream to [facility]. It should be ready for pick up.

## 2012-04-19 ENCOUNTER — Ambulatory Visit (INDEPENDENT_AMBULATORY_CARE_PROVIDER_SITE_OTHER): Payer: Medicare Other | Admitting: Surgery

## 2012-04-19 ENCOUNTER — Encounter (INDEPENDENT_AMBULATORY_CARE_PROVIDER_SITE_OTHER): Payer: Self-pay | Admitting: Surgery

## 2012-04-19 VITALS — BP 118/62 | HR 45 | Temp 97.4°F | Resp 18 | Ht 65.0 in | Wt 157.6 lb

## 2012-04-19 DIAGNOSIS — K602 Anal fissure, unspecified: Secondary | ICD-10-CM

## 2012-04-19 NOTE — Patient Instructions (Signed)
Take 4 citrucel tablets and only 1 stool softner in AM and PM.

## 2012-04-19 NOTE — Progress Notes (Signed)
Subjective:     Patient ID: Nathaniel Harvey, male   DOB: 1934-07-16, 78 y.o.   MRN: [id number]  HPI   Patient returns for followup of his internal hemorrhoid disease and anal fissure.  He has minimal rectal bleeding, itching  Or pain The anal skin itches from time to time.  He feels better.  Having multiple BM.      Review of Systems  Constitutional: Negative.   HENT: Negative.   Gastrointestinal: Negative for anal bleeding and rectal pain.  Genitourinary: Negative.   Musculoskeletal: Negative.        Objective:   Physical Exam  Constitutional: He is oriented to person, place, and time. He appears well-developed and well-nourished.  HENT:  Head: Normocephalic and atraumatic.  Neck: Normal range of motion. Neck supple.  Genitourinary: Rectum normal. Posterior anal fissure healing.  No hemorrhoid prolapse      Neurological: He is alert and oriented to person, place, and time.  Skin: Skin is warm and dry.  Psychiatric: He has a normal mood and affect. His behavior is normal. Judgment and thought content normal.       Assessment:     Grade 1 internal hemorrhoids stable Anal fissure Pruritis ani    Plan:     Return in  2 months.  Diltiazem gel  Every 6 hours.  High fiber diet.reduce stool softner amount.

## 2012-05-17 ENCOUNTER — Other Ambulatory Visit: Payer: Self-pay | Admitting: Internal Medicine

## 2012-05-21 ENCOUNTER — Ambulatory Visit (INDEPENDENT_AMBULATORY_CARE_PROVIDER_SITE_OTHER): Payer: 59 | Admitting: Internal Medicine

## 2012-05-21 ENCOUNTER — Encounter: Payer: Self-pay | Admitting: Internal Medicine

## 2012-05-21 ENCOUNTER — Other Ambulatory Visit (INDEPENDENT_AMBULATORY_CARE_PROVIDER_SITE_OTHER): Payer: Medicaid Other

## 2012-05-21 VITALS — BP 100/70 | HR 84 | Temp 97.6°F | Resp 16 | Wt 154.0 lb

## 2012-05-21 DIAGNOSIS — K649 Unspecified hemorrhoids: Secondary | ICD-10-CM

## 2012-05-21 DIAGNOSIS — R972 Elevated prostate specific antigen [PSA]: Secondary | ICD-10-CM

## 2012-05-21 DIAGNOSIS — K5900 Constipation, unspecified: Secondary | ICD-10-CM

## 2012-05-21 DIAGNOSIS — L290 Pruritus ani: Secondary | ICD-10-CM

## 2012-05-21 DIAGNOSIS — L853 Xerosis cutis: Secondary | ICD-10-CM

## 2012-05-21 DIAGNOSIS — L258 Unspecified contact dermatitis due to other agents: Secondary | ICD-10-CM

## 2012-05-21 DIAGNOSIS — M25569 Pain in unspecified knee: Secondary | ICD-10-CM

## 2012-05-21 DIAGNOSIS — Z23 Encounter for immunization: Secondary | ICD-10-CM

## 2012-05-21 DIAGNOSIS — E782 Mixed hyperlipidemia: Secondary | ICD-10-CM

## 2012-05-21 DIAGNOSIS — K219 Gastro-esophageal reflux disease without esophagitis: Secondary | ICD-10-CM

## 2012-05-21 DIAGNOSIS — E785 Hyperlipidemia, unspecified: Secondary | ICD-10-CM

## 2012-05-21 LAB — HEPATIC FUNCTION PANEL
Bilirubin, Direct: 0.2 mg/dL (ref 0.0–0.3)
Total Protein: 6.3 g/dL (ref 6.0–8.3)

## 2012-05-21 LAB — BASIC METABOLIC PANEL
CO2: 29 mEq/L (ref 19–32)
Calcium: 9.2 mg/dL (ref 8.4–10.5)
Creatinine, Ser: 0.8 mg/dL (ref 0.4–1.5)
GFR: 104.3 mL/min (ref >60.00)
Sodium: 140 mEq/L (ref 135–145)

## 2012-05-21 LAB — LIPID PANEL
HDL: 70.1 mg/dL (ref >39.00)
Total CHOL/HDL Ratio: 2
Triglycerides: 52 mg/dL (ref 0.0–149.0)
VLDL: 10.4 mg/dL (ref 0.0–40.0)

## 2012-05-21 MED ORDER — ROSUVASTATIN CALCIUM 40 MG PO TABS: 40.0000 mg | ORAL_TABLET | Freq: Every day | ORAL | Status: DC

## 2012-05-21 MED ORDER — KETOCONAZOLE 2 % EX CREA: TOPICAL_CREAM | Freq: Two times a day (BID) | CUTANEOUS | Status: DC

## 2012-05-21 MED ORDER — TRIAMCINOLONE ACETONIDE 0.1 % EX CREA: TOPICAL_CREAM | Freq: Three times a day (TID) | CUTANEOUS | Status: DC

## 2012-05-21 MED ORDER — LINACLOTIDE 290 MCG PO CAPS: 1.0000 | ORAL_CAPSULE | Freq: Every day | ORAL | Status: DC

## 2012-05-21 MED ORDER — RANITIDINE HCL 300 MG PO TABS: 300.0000 mg | ORAL_TABLET | Freq: Every day | ORAL | Status: DC

## 2012-05-21 NOTE — Assessment & Plan Note (Signed)
Continue with current prescription therapy as reflected on the Med list.  

## 2012-05-21 NOTE — Progress Notes (Signed)
   Subjective:    Patient ID: Nathaniel Harvey, male    DOB: [birth year], 78 y.o.   MRN: [id number]  HPI   The patient is here to follow up on chronic hemorrhoids, dyslipidemia and chronic moderate OA symptoms controlled with medicines some. C/o itching on back off and on - better. C/o constipation. He has to sit for an hour to have a BM   Review of Systems  Constitutional: Negative for appetite change, fatigue and unexpected weight change.  HENT: Negative for nosebleeds, congestion, sore throat, sneezing, trouble swallowing and neck pain.   Eyes: Negative for itching and visual disturbance.  Respiratory: Negative for cough.   Cardiovascular: Negative for chest pain, palpitations and leg swelling.  Gastrointestinal: Positive for rectal pain. Negative for nausea, diarrhea, blood in stool, abdominal distention and anal bleeding.  Genitourinary: Negative for frequency and hematuria.  Musculoskeletal: Positive for arthralgias. Negative for back pain, joint swelling and gait problem.  Skin: Negative for rash.  Neurological: Negative for dizziness, tremors, speech difficulty and weakness.  Psychiatric/Behavioral: Negative for disturbed wake/sleep cycle, dysphoric mood and agitation. The patient is not nervous/anxious.        Objective:   Physical Exam  Constitutional: He is oriented to person, place, and time. He appears well-developed.  HENT:  Mouth/Throat: Oropharynx is clear and moist.  Eyes: Conjunctivae normal are normal. Pupils are equal, round, and reactive to light.  Neck: Normal range of motion. No JVD present. No thyromegaly present.  Cardiovascular: Normal rate, regular rhythm, normal heart sounds and intact distal pulses.  Exam reveals no gallop and no friction rub.   No murmur heard. Pulmonary/Chest: Effort normal and breath sounds normal. No respiratory distress. He has no wheezes. He has no rales. He exhibits no tenderness.  Abdominal: Soft. Bowel sounds are normal. He  exhibits no distension and no mass. There is no tenderness. There is no rebound and no guarding.  Genitourinary:       declined  Musculoskeletal: Normal range of motion. He exhibits no edema and no tenderness.  Lymphadenopathy:    He has no cervical adenopathy.  Neurological: He is alert and oriented to person, place, and time. He has normal reflexes. No cranial nerve deficit. He exhibits normal muscle tone. Coordination normal.  Skin: Skin is warm and dry. No rash noted.  Psychiatric: He has a normal mood and affect. His behavior is normal. Judgment and thought content normal.  No rash Scoliosis is present   Lab Results  Component Value Date   WBC 8.3 08/25/2010   HGB 8.5* 08/25/2010   HCT 25.2* 08/25/2010   PLT 146* 08/25/2010   GLUCOSE 82 06/19/2011   CHOL 188 06/19/2011   TRIG 34.0 06/19/2011   HDL 82.60 06/19/2011   LDLDIRECT 134.3 06/23/2009   LDLCALC 99 06/19/2011   ALT 26 06/19/2011   AST 33 06/19/2011   NA 139 06/19/2011   K 4.1 06/19/2011   CL 104 06/19/2011   CREATININE 0.9 06/19/2011   BUN 20 06/19/2011   CO2 28 06/19/2011   TSH 1.97 06/19/2011   PSA 6.02* 02/25/2010   INR 1.14 08/16/2010   HGBA1C 5.8 02/12/2008        Assessment & Plan:

## 2012-05-21 NOTE — Assessment & Plan Note (Addendum)
Linzess prn Align x 3 wks 

## 2012-05-21 NOTE — Assessment & Plan Note (Signed)
Linzess prn Align x 3 wks

## 2012-05-21 NOTE — Assessment & Plan Note (Signed)
Better  

## 2012-06-21 ENCOUNTER — Encounter (INDEPENDENT_AMBULATORY_CARE_PROVIDER_SITE_OTHER): Payer: Self-pay | Admitting: Surgery

## 2012-06-21 ENCOUNTER — Ambulatory Visit (INDEPENDENT_AMBULATORY_CARE_PROVIDER_SITE_OTHER): Payer: 59 | Admitting: Surgery

## 2012-06-21 ENCOUNTER — Telehealth (INDEPENDENT_AMBULATORY_CARE_PROVIDER_SITE_OTHER): Payer: Self-pay

## 2012-06-21 VITALS — BP 114/72 | HR 72 | Temp 97.8°F | Resp 16 | Ht 65.0 in | Wt 158.6 lb

## 2012-06-21 DIAGNOSIS — K602 Anal fissure, unspecified: Secondary | ICD-10-CM

## 2012-06-21 NOTE — Telephone Encounter (Signed)
I spoke with [employer] rx authorization rep. I gave her the 4 compounds that needed to be pre authorized per Jacki Cones at [facility]. The per told me that the compounds did not need pre authorizing and that when patient goes to get prescription for diltiazem it should be covered thru insurance. She told me to tell patient to have pharmacy call them if they have any issue with it. I told patient this information at he understood.

## 2012-06-21 NOTE — Progress Notes (Signed)
Subjective:     Patient ID: Nathaniel Harvey, male   DOB: [birth year], 78 y.o.   MRN: [id number]  HPI   Patient returns for followup of his internal hemorrhoid disease and anal fissure.  He has minimal rectal bleeding, itching  Or pain The anal skin itches from time to time.  He feels better.  Having multiple BM.      Review of Systems  Constitutional: Negative.   HENT: Negative.   Gastrointestinal: Negative for anal bleeding and rectal pain.  Genitourinary: Negative.   Musculoskeletal: Negative.        Objective:   Physical Exam  Constitutional: He is oriented to person, place, and time. He appears well-developed and well-nourished.  HENT:  Head: Normocephalic and atraumatic.  Neck: Normal range of motion. Neck supple.  Genitourinary: Rectum normal. Posterior anal fissure healing.  No hemorrhoid prolapse      Neurological: He is alert and oriented to person, place, and time.  Skin: Skin is warm and dry.  Psychiatric: He has a normal mood and affect. His behavior is normal. Judgment and thought content normal.       Assessment:     Grade 1 internal hemorrhoids stable Anal fissure healed Pruritis ani    Plan:     Improved.  Return as needed

## 2012-06-21 NOTE — Patient Instructions (Signed)
Return as needed

## 2012-08-09 ENCOUNTER — Telehealth (INDEPENDENT_AMBULATORY_CARE_PROVIDER_SITE_OTHER): Payer: Self-pay | Admitting: General Surgery

## 2012-08-09 NOTE — Telephone Encounter (Signed)
Please call [facility] [phone] for prior auth on the diltiazem so that they can pay for the medication...need this done ASAP.If you have any questions please call Jacki Cones [facility]

## 2012-08-13 NOTE — Telephone Encounter (Signed)
Pre auth form filled out and faxed in

## 2012-09-09 ENCOUNTER — Other Ambulatory Visit: Payer: Self-pay | Admitting: Internal Medicine

## 2012-09-20 ENCOUNTER — Encounter: Payer: Self-pay | Admitting: Internal Medicine

## 2012-09-20 ENCOUNTER — Ambulatory Visit (INDEPENDENT_AMBULATORY_CARE_PROVIDER_SITE_OTHER): Payer: 59 | Admitting: Internal Medicine

## 2012-09-20 VITALS — BP 120/72 | HR 72 | Temp 98.1°F | Resp 16 | Wt 156.0 lb

## 2012-09-20 DIAGNOSIS — R972 Elevated prostate specific antigen [PSA]: Secondary | ICD-10-CM

## 2012-09-20 DIAGNOSIS — E785 Hyperlipidemia, unspecified: Secondary | ICD-10-CM

## 2012-09-20 DIAGNOSIS — K219 Gastro-esophageal reflux disease without esophagitis: Secondary | ICD-10-CM

## 2012-09-20 DIAGNOSIS — N320 Bladder-neck obstruction: Secondary | ICD-10-CM

## 2012-09-20 DIAGNOSIS — K602 Anal fissure, unspecified: Secondary | ICD-10-CM

## 2012-09-20 DIAGNOSIS — R609 Edema, unspecified: Secondary | ICD-10-CM

## 2012-09-20 MED ORDER — LINACLOTIDE 290 MCG PO CAPS: 1.0000 | ORAL_CAPSULE | Freq: Every day | ORAL | Status: DC

## 2012-09-20 MED ORDER — DILTIAZEM GEL 2 %: 1.0000 "application " | Freq: Two times a day (BID) | CUTANEOUS | Status: DC

## 2012-09-20 MED ORDER — TRIAMCINOLONE ACETONIDE 0.1 % EX CREA: TOPICAL_CREAM | Freq: Three times a day (TID) | CUTANEOUS | Status: DC

## 2012-09-20 MED ORDER — ROSUVASTATIN CALCIUM 40 MG PO TABS: 40.0000 mg | ORAL_TABLET | Freq: Every day | ORAL | Status: DC

## 2012-09-20 NOTE — Assessment & Plan Note (Signed)
Continue with current prescription therapy as reflected on the Med list.  

## 2012-09-20 NOTE — Progress Notes (Signed)
Patient ID: Nathaniel Harvey, male   DOB: 1933-10-18, 79 y.o.   MRN: [id number]   Subjective:    HPI   The patient is here to follow up on chronic hemorrhoids, dyslipidemia and chronic moderate OA symptoms controlled with medicines some. C/o itching on back off and on - better. C/o constipation. He has to sit for an hour to have a BM   Review of Systems  Constitutional: Negative for appetite change, fatigue and unexpected weight change.  HENT: Negative for nosebleeds, congestion, sore throat, sneezing, trouble swallowing and neck pain.   Eyes: Negative for itching and visual disturbance.  Respiratory: Negative for cough.   Cardiovascular: Negative for chest pain, palpitations and leg swelling.  Gastrointestinal: Positive for rectal pain. Negative for nausea, diarrhea, blood in stool, abdominal distention and anal bleeding.  Genitourinary: Negative for frequency and hematuria.  Musculoskeletal: Positive for arthralgias. Negative for back pain, joint swelling and gait problem.  Skin: Negative for rash.  Neurological: Negative for dizziness, tremors, speech difficulty and weakness.  Psychiatric/Behavioral: Negative for sleep disturbance, dysphoric mood and agitation. The patient is not nervous/anxious.        Objective:   Physical Exam  Constitutional: He is oriented to person, place, and time. He appears well-developed.  HENT:  Mouth/Throat: Oropharynx is clear and moist.  Eyes: Conjunctivae are normal. Pupils are equal, round, and reactive to light.  Neck: Normal range of motion. No JVD present. No thyromegaly present.  Cardiovascular: Normal rate, regular rhythm, normal heart sounds and intact distal pulses.  Exam reveals no gallop and no friction rub.   No murmur heard. Pulmonary/Chest: Effort normal and breath sounds normal. No respiratory distress. He has no wheezes. He has no rales. He exhibits no tenderness.  Abdominal: Soft. Bowel sounds are normal. He exhibits no distension  and no mass. There is no tenderness. There is no rebound and no guarding.  Genitourinary:  declined  Musculoskeletal: Normal range of motion. He exhibits no edema and no tenderness.  Lymphadenopathy:    He has no cervical adenopathy.  Neurological: He is alert and oriented to person, place, and time. He has normal reflexes. No cranial nerve deficit. He exhibits normal muscle tone. Coordination normal.  Skin: Skin is warm and dry. No rash noted.  Psychiatric: He has a normal mood and affect. His behavior is normal. Judgment and thought content normal.  No rash Scoliosis is present   Lab Results  Component Value Date   WBC 8.3 08/25/2010   HGB 8.5* 08/25/2010   HCT 25.2* 08/25/2010   PLT 146* 08/25/2010   GLUCOSE 79 05/21/2012   CHOL 175 05/21/2012   TRIG 52.0 05/21/2012   HDL 70.10 05/21/2012   LDLDIRECT 134.3 06/23/2009   LDLCALC 95 05/21/2012   ALT 20 05/21/2012   AST 24 05/21/2012   NA 140 05/21/2012   K 4.6 05/21/2012   CL 107 05/21/2012   CREATININE 0.8 05/21/2012   BUN 14 05/21/2012   CO2 29 05/21/2012   TSH 1.97 06/19/2011   PSA 6.02* 02/25/2010   INR 1.14 08/16/2010   HGBA1C 5.8 02/12/2008        Assessment & Plan:

## 2012-10-11 ENCOUNTER — Other Ambulatory Visit: Payer: Self-pay | Admitting: Internal Medicine

## 2012-12-09 ENCOUNTER — Other Ambulatory Visit: Payer: Self-pay | Admitting: Internal Medicine

## 2013-01-17 ENCOUNTER — Ambulatory Visit (INDEPENDENT_AMBULATORY_CARE_PROVIDER_SITE_OTHER): Payer: 59 | Admitting: Internal Medicine

## 2013-01-17 ENCOUNTER — Encounter: Payer: Self-pay | Admitting: Internal Medicine

## 2013-01-17 VITALS — BP 140/70 | HR 64 | Temp 96.8°F | Resp 16 | Wt 155.0 lb

## 2013-01-17 DIAGNOSIS — L258 Unspecified contact dermatitis due to other agents: Secondary | ICD-10-CM

## 2013-01-17 DIAGNOSIS — M1990 Unspecified osteoarthritis, unspecified site: Secondary | ICD-10-CM

## 2013-01-17 DIAGNOSIS — L853 Xerosis cutis: Secondary | ICD-10-CM

## 2013-01-17 DIAGNOSIS — R413 Other amnesia: Secondary | ICD-10-CM | POA: Insufficient documentation

## 2013-01-17 MED ORDER — DONEPEZIL HCL 5 MG PO TABS: 5.0000 mg | ORAL_TABLET | Freq: Every day | ORAL | Status: DC

## 2013-01-17 NOTE — Progress Notes (Signed)
   Subjective:    HPI   The patient is here to follow up on chronic hemorrhoids, dyslipidemia and chronic moderate OA symptoms controlled with medicines some. C/o itching on back off and on - better. C/o constipation. He has to sit for an hour to have a BM C/o memory loss  Review of Systems  Constitutional: Negative for appetite change, fatigue and unexpected weight change.  HENT: Negative for nosebleeds, congestion, sore throat, sneezing, trouble swallowing and neck pain.   Eyes: Negative for itching and visual disturbance.  Respiratory: Negative for cough.   Cardiovascular: Negative for chest pain, palpitations and leg swelling.  Gastrointestinal: Positive for rectal pain. Negative for nausea, diarrhea, blood in stool, abdominal distention and anal bleeding.  Genitourinary: Negative for frequency and hematuria.  Musculoskeletal: Positive for arthralgias. Negative for back pain, joint swelling and gait problem.  Skin: Negative for rash.  Neurological: Negative for dizziness, tremors, speech difficulty and weakness.  Psychiatric/Behavioral: Negative for sleep disturbance, dysphoric mood and agitation. The patient is not nervous/anxious.        Objective:   Physical Exam  Constitutional: He is oriented to person, place, and time. He appears well-developed.  HENT:  Mouth/Throat: Oropharynx is clear and moist.  Eyes: Conjunctivae are normal. Pupils are equal, round, and reactive to light.  Neck: Normal range of motion. No JVD present. No thyromegaly present.  Cardiovascular: Normal rate, regular rhythm, normal heart sounds and intact distal pulses.  Exam reveals no gallop and no friction rub.   No murmur heard. Pulmonary/Chest: Effort normal and breath sounds normal. No respiratory distress. He has no wheezes. He has no rales. He exhibits no tenderness.  Abdominal: Soft. Bowel sounds are normal. He exhibits no distension and no mass. There is no tenderness. There is no rebound and no  guarding.  Genitourinary:  declined  Musculoskeletal: Normal range of motion. He exhibits no edema and no tenderness.  Lymphadenopathy:    He has no cervical adenopathy.  Neurological: He is alert and oriented to person, place, and time. He has normal reflexes. No cranial nerve deficit. He exhibits normal muscle tone. Coordination normal.  Skin: Skin is warm and dry. No rash noted.  Psychiatric: He has a normal mood and affect. His behavior is normal. Judgment and thought content normal.  No rash Scoliosis is present Serial 7s w/errors Recalls 2.5/3 A/o/c   Lab Results  Component Value Date   WBC 8.3 08/25/2010   HGB 8.5* 08/25/2010   HCT 25.2* 08/25/2010   PLT 146* 08/25/2010   GLUCOSE 79 05/21/2012   CHOL 175 05/21/2012   TRIG 52.0 05/21/2012   HDL 70.10 05/21/2012   LDLDIRECT 134.3 06/23/2009   LDLCALC 95 05/21/2012   ALT 20 05/21/2012   AST 24 05/21/2012   NA 140 05/21/2012   K 4.6 05/21/2012   CL 107 05/21/2012   CREATININE 0.8 05/21/2012   BUN 14 05/21/2012   CO2 29 05/21/2012   TSH 1.97 06/19/2011   PSA 6.02* 02/25/2010   INR 1.14 08/16/2010   HGBA1C 5.8 02/12/2008        Assessment & Plan:

## 2013-01-17 NOTE — Assessment & Plan Note (Signed)
Discussed.

## 2013-01-24 ENCOUNTER — Ambulatory Visit: Payer: 59 | Admitting: Internal Medicine

## 2013-01-27 NOTE — Assessment & Plan Note (Signed)
Continue with current prescription therapy as reflected on the Med list.  

## 2013-02-05 ENCOUNTER — Other Ambulatory Visit: Payer: Self-pay | Admitting: Internal Medicine

## 2013-02-06 ENCOUNTER — Other Ambulatory Visit: Payer: Self-pay | Admitting: Internal Medicine

## 2013-04-10 ENCOUNTER — Other Ambulatory Visit: Payer: Self-pay | Admitting: Internal Medicine

## 2013-04-14 ENCOUNTER — Other Ambulatory Visit: Payer: Self-pay | Admitting: Internal Medicine

## 2013-04-16 ENCOUNTER — Other Ambulatory Visit: Payer: Self-pay | Admitting: Internal Medicine

## 2013-04-16 ENCOUNTER — Telehealth (INDEPENDENT_AMBULATORY_CARE_PROVIDER_SITE_OTHER): Payer: Self-pay

## 2013-04-16 ENCOUNTER — Other Ambulatory Visit (INDEPENDENT_AMBULATORY_CARE_PROVIDER_SITE_OTHER): Payer: Self-pay

## 2013-04-16 DIAGNOSIS — K602 Anal fissure, unspecified: Secondary | ICD-10-CM

## 2013-04-16 MED ORDER — DILTIAZEM GEL 2 %: 1.0000 "application " | Freq: Two times a day (BID) | CUTANEOUS | Status: DC

## 2013-04-16 NOTE — Telephone Encounter (Signed)
LMOM> refill sent in to pharmacy

## 2013-04-16 NOTE — Telephone Encounter (Signed)
Message copied by Brennan Bailey on Wed Apr 16, 2013  8:36 AM ------      Message from: Harriette Bouillon A      Created: Wed Apr 16, 2013  7:51 AM      Regarding: RE: RX Refill Needed      Contact: [phone]       ok      ----- Message -----         From: Brennan Bailey, CMA         Sent: 04/15/2013   1:34 PM           To: Thomas A. Cornett, MD      Subject: FW: RX Refill Needed                                     Ok to refill?      ----- Message -----         From: Parks Neptune         Sent: 04/15/2013  12:57 PM           To: Brennan Bailey, CMA      Subject: RX Refill Needed                                         Pt stopped in today extremely anxious to have his Diltiazem refill for another yr.  He used his last refill today.  Also he wants you to call him to confirm the refill will be approved by Dr. Luisa Hart.  DF             ------

## 2013-04-17 ENCOUNTER — Telehealth (INDEPENDENT_AMBULATORY_CARE_PROVIDER_SITE_OTHER): Payer: Self-pay

## 2013-04-17 NOTE — Telephone Encounter (Signed)
Received fax from [facility] regarding refill for dilitizam. Refill was sent in to pharmacy yesterday with 11 additional refills. Called to clarify. She states refills in system expire in 2 days. Gave verbal to extend to 12 refills for the next year.

## 2013-05-09 ENCOUNTER — Encounter: Payer: Self-pay | Admitting: Internal Medicine

## 2013-05-09 ENCOUNTER — Ambulatory Visit (INDEPENDENT_AMBULATORY_CARE_PROVIDER_SITE_OTHER): Payer: 59 | Admitting: Internal Medicine

## 2013-05-09 ENCOUNTER — Other Ambulatory Visit (INDEPENDENT_AMBULATORY_CARE_PROVIDER_SITE_OTHER): Payer: 59

## 2013-05-09 ENCOUNTER — Ambulatory Visit (INDEPENDENT_AMBULATORY_CARE_PROVIDER_SITE_OTHER)
Admission: RE | Admit: 2013-05-09 | Discharge: 2013-05-09 | Disposition: A | Discharge status: Home / Self Care | Payer: 59 | Source: Ambulatory Visit | Attending: Internal Medicine | Admitting: Internal Medicine

## 2013-05-09 VITALS — BP 136/72 | HR 60 | Temp 97.1°F | Resp 16 | Wt 158.0 lb

## 2013-05-09 DIAGNOSIS — K219 Gastro-esophageal reflux disease without esophagitis: Secondary | ICD-10-CM

## 2013-05-09 DIAGNOSIS — E785 Hyperlipidemia, unspecified: Secondary | ICD-10-CM

## 2013-05-09 DIAGNOSIS — K602 Anal fissure, unspecified: Secondary | ICD-10-CM

## 2013-05-09 DIAGNOSIS — R609 Edema, unspecified: Secondary | ICD-10-CM

## 2013-05-09 DIAGNOSIS — M542 Cervicalgia: Secondary | ICD-10-CM | POA: Insufficient documentation

## 2013-05-09 DIAGNOSIS — M5412 Radiculopathy, cervical region: Secondary | ICD-10-CM

## 2013-05-09 DIAGNOSIS — Z23 Encounter for immunization: Secondary | ICD-10-CM

## 2013-05-09 DIAGNOSIS — R972 Elevated prostate specific antigen [PSA]: Secondary | ICD-10-CM

## 2013-05-09 DIAGNOSIS — N320 Bladder-neck obstruction: Secondary | ICD-10-CM

## 2013-05-09 LAB — BASIC METABOLIC PANEL
CO2: 27 mEq/L (ref 19–32)
Calcium: 9.1 mg/dL (ref 8.4–10.5)
GFR: 104.04 mL/min (ref >60.00)
Glucose, Bld: 84 mg/dL (ref 70–99)
Potassium: 4.3 mEq/L (ref 3.5–5.1)
Sodium: 141 mEq/L (ref 135–145)

## 2013-05-09 LAB — HEPATIC FUNCTION PANEL
ALT: 20 U/L (ref 0–53)
AST: 30 U/L (ref 0–37)
Alkaline Phosphatase: 50 U/L (ref 39–117)
Total Bilirubin: 0.7 mg/dL (ref 0.3–1.2)

## 2013-05-09 LAB — LIPID PANEL
Cholesterol: 166 mg/dL (ref 0–200)
HDL: 68.4 mg/dL (ref >39.00)
Total CHOL/HDL Ratio: 2

## 2013-05-09 MED ORDER — PREDNISONE 10 MG PO TABS: ORAL_TABLET | ORAL | Status: DC

## 2013-05-09 MED ORDER — TRAMADOL HCL 50 MG PO TABS: 50.0000 mg | ORAL_TABLET | Freq: Two times a day (BID) | ORAL | Status: DC | PRN

## 2013-05-09 MED ORDER — METHYLPREDNISOLONE ACETATE 80 MG/ML IJ SUSP
80.0000 mg | Freq: Once | INTRAMUSCULAR | Status: AC
  Administered 2013-05-09: 80 mg via INTRAMUSCULAR

## 2013-05-09 MED ORDER — CYCLOBENZAPRINE HCL 10 MG PO TABS: 10.0000 mg | ORAL_TABLET | Freq: Two times a day (BID) | ORAL | Status: DC | PRN

## 2013-05-09 NOTE — Assessment & Plan Note (Signed)
X ray See meds ROM execises

## 2013-05-09 NOTE — Assessment & Plan Note (Signed)
Worse on NSAID

## 2013-05-09 NOTE — Progress Notes (Signed)
Patient ID: Nathaniel Harvey, male   DOB: 1934-06-18, 79 y.o.   MRN: [id number]   Subjective:    HPI  C/o severe L neck/shoulder pain x 3 wks after lifting a child  The patient is here to follow up on chronic hemorrhoids, dyslipidemia and chronic moderate OA symptoms controlled with medicines some. C/o itching on back off and on - better. C/o constipation. He has to sit for an hour to have a BM C/o memory loss  Review of Systems  Constitutional: Negative for appetite change, fatigue and unexpected weight change.  HENT: Negative for congestion, nosebleeds, sneezing, sore throat and trouble swallowing.   Eyes: Negative for itching and visual disturbance.  Respiratory: Negative for cough.   Cardiovascular: Negative for chest pain, palpitations and leg swelling.  Gastrointestinal: Positive for rectal pain. Negative for nausea, diarrhea, blood in stool, abdominal distention and anal bleeding.  Genitourinary: Negative for frequency and hematuria.  Musculoskeletal: Positive for arthralgias. Negative for back pain, gait problem, joint swelling and neck pain.  Skin: Negative for rash.  Neurological: Negative for dizziness, tremors, speech difficulty and weakness.  Psychiatric/Behavioral: Negative for sleep disturbance, dysphoric mood and agitation. The patient is not nervous/anxious.        Objective:   Physical Exam  Constitutional: He is oriented to person, place, and time. He appears well-developed.  HENT:  Mouth/Throat: Oropharynx is clear and moist.  Eyes: Conjunctivae are normal. Pupils are equal, round, and reactive to light.  Neck: Normal range of motion. No JVD present. No thyromegaly present.  Cardiovascular: Normal rate, regular rhythm, normal heart sounds and intact distal pulses.  Exam reveals no gallop and no friction rub.   No murmur heard. Pulmonary/Chest: Effort normal and breath sounds normal. No respiratory distress. He has no wheezes. He has no rales. He exhibits no  tenderness.  Abdominal: Soft. Bowel sounds are normal. He exhibits no distension and no mass. There is no tenderness. There is no rebound and no guarding.  Genitourinary:  declined  Musculoskeletal: Normal range of motion. He exhibits no edema and no tenderness.  Lymphadenopathy:    He has no cervical adenopathy.  Neurological: He is alert and oriented to person, place, and time. He has normal reflexes. No cranial nerve deficit. He exhibits normal muscle tone. Coordination normal.  Skin: Skin is warm and dry. No rash noted.  Psychiatric: He has a normal mood and affect. His behavior is normal. Judgment and thought content normal.  No rash Scoliosis is present Serial 7s w/errors Recalls 2.5/3 A/o/c   Lab Results  Component Value Date   WBC 8.3 08/25/2010   HGB 8.5* 08/25/2010   HCT 25.2* 08/25/2010   PLT 146* 08/25/2010   GLUCOSE 79 05/21/2012   CHOL 175 05/21/2012   TRIG 52.0 05/21/2012   HDL 70.10 05/21/2012   LDLDIRECT 134.3 06/23/2009   LDLCALC 95 05/21/2012   ALT 20 05/21/2012   AST 24 05/21/2012   NA 140 05/21/2012   K 4.6 05/21/2012   CL 107 05/21/2012   CREATININE 0.8 05/21/2012   BUN 14 05/21/2012   CO2 29 05/21/2012   TSH 1.97 06/19/2011   PSA 6.02* 02/25/2010   INR 1.14 08/16/2010   HGBA1C 5.8 02/12/2008        Assessment & Plan:

## 2013-05-16 ENCOUNTER — Ambulatory Visit: Payer: 59 | Admitting: Internal Medicine

## 2013-05-22 ENCOUNTER — Ambulatory Visit: Payer: 59 | Admitting: Internal Medicine

## 2013-05-30 ENCOUNTER — Encounter: Payer: Self-pay | Admitting: Internal Medicine

## 2013-05-30 ENCOUNTER — Ambulatory Visit (INDEPENDENT_AMBULATORY_CARE_PROVIDER_SITE_OTHER): Payer: 59 | Admitting: Internal Medicine

## 2013-05-30 VITALS — BP 130/72 | HR 72 | Temp 97.4°F | Resp 16 | Wt 159.0 lb

## 2013-05-30 DIAGNOSIS — M1990 Unspecified osteoarthritis, unspecified site: Secondary | ICD-10-CM

## 2013-05-30 DIAGNOSIS — K5900 Constipation, unspecified: Secondary | ICD-10-CM

## 2013-05-30 DIAGNOSIS — M5412 Radiculopathy, cervical region: Secondary | ICD-10-CM

## 2013-05-30 MED ORDER — PREDNISONE 10 MG PO TABS: ORAL_TABLET | ORAL | Status: DC

## 2013-05-30 MED ORDER — CYCLOBENZAPRINE HCL 10 MG PO TABS: 10.0000 mg | ORAL_TABLET | Freq: Two times a day (BID) | ORAL | Status: DC | PRN

## 2013-05-30 MED ORDER — METHYLPREDNISOLONE ACETATE 80 MG/ML IJ SUSP
80.0000 mg | Freq: Once | INTRAMUSCULAR | Status: AC
  Administered 2013-05-30: 80 mg via INTRAMUSCULAR

## 2013-05-30 MED ORDER — TRAMADOL HCL 50 MG PO TABS: 50.0000 mg | ORAL_TABLET | Freq: Two times a day (BID) | ORAL | Status: DC | PRN

## 2013-05-30 NOTE — Progress Notes (Signed)
   Subjective:    HPI  F/u severe L neck/shoulder pain x 3 wks after lifting a child - better now, however still hurts w/ROM - sharp pain...  The patient is here to follow up on chronic hemorrhoids, dyslipidemia and chronic moderate OA symptoms controlled with medicines some. C/o itching on back off and on - better. C/o constipation. He has to sit for an hour to have a BM C/o memory loss  Review of Systems  Constitutional: Negative for appetite change, fatigue and unexpected weight change.  HENT: Negative for congestion, nosebleeds, sneezing, sore throat and trouble swallowing.   Eyes: Negative for itching and visual disturbance.  Respiratory: Negative for cough.   Cardiovascular: Negative for chest pain, palpitations and leg swelling.  Gastrointestinal: Positive for rectal pain. Negative for nausea, diarrhea, blood in stool, abdominal distention and anal bleeding.  Genitourinary: Negative for frequency and hematuria.  Musculoskeletal: Positive for arthralgias. Negative for back pain, gait problem, joint swelling and neck pain.  Skin: Negative for rash.  Neurological: Negative for dizziness, tremors, speech difficulty and weakness.  Psychiatric/Behavioral: Negative for sleep disturbance, dysphoric mood and agitation. The patient is not nervous/anxious.        Objective:   Physical Exam  Constitutional: He is oriented to person, place, and time. He appears well-developed.  HENT:  Mouth/Throat: Oropharynx is clear and moist.  Eyes: Conjunctivae are normal. Pupils are equal, round, and reactive to light.  Neck: Normal range of motion. No JVD present. No thyromegaly present.  Cardiovascular: Normal rate, regular rhythm, normal heart sounds and intact distal pulses.  Exam reveals no gallop and no friction rub.   No murmur heard. Pulmonary/Chest: Effort normal and breath sounds normal. No respiratory distress. He has no wheezes. He has no rales. He exhibits no tenderness.  Abdominal:  Soft. Bowel sounds are normal. He exhibits no distension and no mass. There is no tenderness. There is no rebound and no guarding.  Genitourinary:  declined  Musculoskeletal: Normal range of motion. He exhibits no edema and no tenderness.  Lymphadenopathy:    He has no cervical adenopathy.  Neurological: He is alert and oriented to person, place, and time. He has normal reflexes. No cranial nerve deficit. He exhibits normal muscle tone. Coordination normal.  Skin: Skin is warm and dry. No rash noted.  Psychiatric: He has a normal mood and affect. His behavior is normal. Judgment and thought content normal.  No rash Scoliosis is present Serial 7s w/errors Recalls 2.5/3 A/o/c   Lab Results  Component Value Date   WBC 8.3 08/25/2010   HGB 8.5* 08/25/2010   HCT 25.2* 08/25/2010   PLT 146* 08/25/2010   GLUCOSE 84 05/09/2013   CHOL 166 05/09/2013   TRIG 76.0 05/09/2013   HDL 68.40 05/09/2013   LDLDIRECT 134.3 06/23/2009   LDLCALC 82 05/09/2013   ALT 20 05/09/2013   AST 30 05/09/2013   NA 141 05/09/2013   K 4.3 05/09/2013   CL 108 05/09/2013   CREATININE 0.8 05/09/2013   BUN 16 05/09/2013   CO2 27 05/09/2013   TSH 1.52 05/09/2013   PSA 6.17* 05/09/2013   INR 1.14 08/16/2010   HGBA1C 5.8 02/12/2008        Assessment & Plan:

## 2013-05-30 NOTE — Assessment & Plan Note (Signed)
Better  

## 2013-05-30 NOTE — Assessment & Plan Note (Signed)
Continue with current prescription therapy as reflected on the Med list.  

## 2013-05-30 NOTE — Assessment & Plan Note (Signed)
Will repeat Prednisone Cont prn Tramadol PT offered MRI if needed

## 2013-06-14 ENCOUNTER — Other Ambulatory Visit: Payer: Self-pay | Admitting: Internal Medicine

## 2013-06-17 ENCOUNTER — Other Ambulatory Visit: Payer: Self-pay | Admitting: Internal Medicine

## 2013-06-18 ENCOUNTER — Other Ambulatory Visit: Payer: Self-pay | Admitting: Internal Medicine

## 2013-07-17 ENCOUNTER — Other Ambulatory Visit: Payer: Self-pay | Admitting: Internal Medicine

## 2013-08-15 ENCOUNTER — Ambulatory Visit (INDEPENDENT_AMBULATORY_CARE_PROVIDER_SITE_OTHER): Payer: 59 | Admitting: Internal Medicine

## 2013-08-15 ENCOUNTER — Other Ambulatory Visit (INDEPENDENT_AMBULATORY_CARE_PROVIDER_SITE_OTHER): Payer: 59

## 2013-08-15 ENCOUNTER — Encounter: Payer: Self-pay | Admitting: Internal Medicine

## 2013-08-15 VITALS — BP 130/70 | HR 68 | Temp 97.3°F | Resp 16 | Wt 144.0 lb

## 2013-08-15 DIAGNOSIS — R1310 Dysphagia, unspecified: Secondary | ICD-10-CM

## 2013-08-15 DIAGNOSIS — K602 Anal fissure, unspecified: Secondary | ICD-10-CM

## 2013-08-15 DIAGNOSIS — K219 Gastro-esophageal reflux disease without esophagitis: Secondary | ICD-10-CM

## 2013-08-15 DIAGNOSIS — E785 Hyperlipidemia, unspecified: Secondary | ICD-10-CM

## 2013-08-15 LAB — BASIC METABOLIC PANEL
BUN: 11 mg/dL (ref 6–23)
CALCIUM: 9.2 mg/dL (ref 8.4–10.5)
CO2: 28 meq/L (ref 19–32)
CREATININE: 0.8 mg/dL (ref 0.4–1.5)
Chloride: 103 mEq/L (ref 96–112)
GFR: 105.54 mL/min (ref >60.00)
Glucose, Bld: 87 mg/dL (ref 70–99)
Potassium: 4.1 mEq/L (ref 3.5–5.1)
Sodium: 138 mEq/L (ref 135–145)

## 2013-08-15 LAB — CBC WITH DIFFERENTIAL/PLATELET
BASOS PCT: 0.3 % (ref 0.0–3.0)
Basophils Absolute: 0 10*3/uL (ref 0.0–0.1)
Eosinophils Absolute: 0.1 10*3/uL (ref 0.0–0.7)
Eosinophils Relative: 0.6 % (ref 0.0–5.0)
HCT: 38.6 % — ABNORMAL LOW (ref 39.0–52.0)
Hemoglobin: 12.7 g/dL — ABNORMAL LOW (ref 13.0–17.0)
Lymphocytes Relative: 12.9 % (ref 12.0–46.0)
Lymphs Abs: 1.1 10*3/uL (ref 0.7–4.0)
MCHC: 32.9 g/dL (ref 30.0–36.0)
MCV: 91.2 fl (ref 78.0–100.0)
Monocytes Absolute: 0.8 10*3/uL (ref 0.1–1.0)
Monocytes Relative: 9.5 % (ref 3.0–12.0)
NEUTROS PCT: 76.7 % (ref 43.0–77.0)
Neutro Abs: 6.7 10*3/uL (ref 1.4–7.7)
PLATELETS: 191 10*3/uL (ref 150.0–400.0)
RBC: 4.23 Mil/uL (ref 4.22–5.81)
RDW: 15.3 % — AB (ref 11.5–14.6)
WBC: 8.7 10*3/uL (ref 4.5–10.5)

## 2013-08-15 LAB — HEPATIC FUNCTION PANEL
ALBUMIN: 3.4 g/dL — AB (ref 3.5–5.2)
ALK PHOS: 59 U/L (ref 39–117)
ALT: 15 U/L (ref 0–53)
AST: 19 U/L (ref 0–37)
Bilirubin, Direct: 0.2 mg/dL (ref 0.0–0.3)
Total Bilirubin: 0.9 mg/dL (ref 0.3–1.2)
Total Protein: 6.3 g/dL (ref 6.0–8.3)

## 2013-08-15 LAB — H. PYLORI ANTIBODY, IGG: H Pylori IgG: NEGATIVE

## 2013-08-15 MED ORDER — KETOCONAZOLE 2 % EX CREA: TOPICAL_CREAM | CUTANEOUS | Status: AC

## 2013-08-15 MED ORDER — LINACLOTIDE 290 MCG PO CAPS: ORAL_CAPSULE | ORAL | Status: DC

## 2013-08-15 MED ORDER — ROSUVASTATIN CALCIUM 40 MG PO TABS: ORAL_TABLET | ORAL | Status: DC

## 2013-08-15 MED ORDER — ESOMEPRAZOLE MAGNESIUM 40 MG PO CPDR: 40.0000 mg | DELAYED_RELEASE_CAPSULE | Freq: Every day | ORAL | Status: DC

## 2013-08-15 MED ORDER — RANITIDINE HCL 300 MG PO TABS: ORAL_TABLET | ORAL | Status: DC

## 2013-08-15 MED ORDER — DILTIAZEM GEL 2 %: 1.0000 "application " | Freq: Two times a day (BID) | CUTANEOUS | Status: AC

## 2013-08-15 MED ORDER — TRAMADOL HCL 50 MG PO TABS: 50.0000 mg | ORAL_TABLET | Freq: Two times a day (BID) | ORAL | Status: AC | PRN

## 2013-08-15 NOTE — Patient Instructions (Signed)
Hold Crestor Nexium 1 twice a day

## 2013-08-15 NOTE — Assessment & Plan Note (Signed)
Labs Nexium added

## 2013-08-15 NOTE — Progress Notes (Signed)
Pre visit review using our clinic review tool, if applicable. No additional management support is needed unless otherwise documented below in the visit note. 

## 2013-08-15 NOTE — Assessment & Plan Note (Signed)
Continue with current prescription therapy as reflected on the Med list.  

## 2013-08-15 NOTE — Progress Notes (Signed)
   Subjective:    HPI  C/o pain and discomfort w/swollowing food, vegetable juice  F/u severe L neck/shoulder pain x 3 wks after lifting a child - better now, however still hurts w/ROM - sharp pain...  The patient is here to follow up on chronic hemorrhoids, dyslipidemia and chronic moderate OA symptoms controlled with medicines some. C/o itching on back off and on - better. C/o constipation. He has to sit for an hour to have a BM C/o memory loss  Review of Systems  Constitutional: Negative for appetite change, fatigue and unexpected weight change.  HENT: Negative for congestion, nosebleeds, sneezing, sore throat and trouble swallowing.   Eyes: Negative for itching and visual disturbance.  Respiratory: Negative for cough.   Cardiovascular: Negative for chest pain, palpitations and leg swelling.  Gastrointestinal: Positive for rectal pain. Negative for nausea, diarrhea, blood in stool, abdominal distention and anal bleeding.  Genitourinary: Negative for frequency and hematuria.  Musculoskeletal: Positive for arthralgias. Negative for back pain, gait problem, joint swelling and neck pain.  Skin: Negative for rash.  Neurological: Negative for dizziness, tremors, speech difficulty and weakness.  Psychiatric/Behavioral: Negative for sleep disturbance, dysphoric mood and agitation. The patient is not nervous/anxious.        Objective:   Physical Exam  Constitutional: He is oriented to person, place, and time. He appears well-developed.  HENT:  Mouth/Throat: Oropharynx is clear and moist.  Eyes: Conjunctivae are normal. Pupils are equal, round, and reactive to light.  Neck: Normal range of motion. No JVD present. No thyromegaly present.  Cardiovascular: Normal rate, regular rhythm, normal heart sounds and intact distal pulses.  Exam reveals no gallop and no friction rub.   No murmur heard. Pulmonary/Chest: Effort normal and breath sounds normal. No respiratory distress. He has no  wheezes. He has no rales. He exhibits no tenderness.  Abdominal: Soft. Bowel sounds are normal. He exhibits no distension and no mass. There is no tenderness. There is no rebound and no guarding.  Genitourinary:  declined  Musculoskeletal: Normal range of motion. He exhibits no edema and no tenderness.  Lymphadenopathy:    He has no cervical adenopathy.  Neurological: He is alert and oriented to person, place, and time. He has normal reflexes. No cranial nerve deficit. He exhibits normal muscle tone. Coordination normal.  Skin: Skin is warm and dry. No rash noted.  Psychiatric: He has a normal mood and affect. His behavior is normal. Judgment and thought content normal.  No rash Scoliosis is present Serial 7s w/errors Recalls 2.5/3 A/o/c   Lab Results  Component Value Date   WBC 8.3 08/25/2010   HGB 8.5* 08/25/2010   HCT 25.2* 08/25/2010   PLT 146* 08/25/2010   GLUCOSE 84 05/09/2013   CHOL 166 05/09/2013   TRIG 76.0 05/09/2013   HDL 68.40 05/09/2013   LDLDIRECT 134.3 06/23/2009   LDLCALC 82 05/09/2013   ALT 20 05/09/2013   AST 30 05/09/2013   NA 141 05/09/2013   K 4.3 05/09/2013   CL 108 05/09/2013   CREATININE 0.8 05/09/2013   BUN 16 05/09/2013   CO2 27 05/09/2013   TSH 1.52 05/09/2013   PSA 6.17* 05/09/2013   INR 1.14 08/16/2010   HGBA1C 5.8 02/12/2008        Assessment & Plan:

## 2013-08-15 NOTE — Assessment & Plan Note (Signed)
Labs

## 2013-08-27 ENCOUNTER — Telehealth: Payer: Self-pay

## 2013-08-27 NOTE — Telephone Encounter (Signed)
OK. Thx

## 2013-08-27 NOTE — Telephone Encounter (Signed)
The patient called and was desperately begging for an appointment tomorrow. He states he is having extreme stomach pain and can not wait till Friday.  Due to the language barrier, the patient can not see a different provider.  I added him into the end of the morning (11am).  There is a 30 min apt from 10:30-11a.   Is this okay for tomorrow?  I didn't know what else to do for the patient considering how upset he was.   Thanks!

## 2013-08-28 ENCOUNTER — Ambulatory Visit (INDEPENDENT_AMBULATORY_CARE_PROVIDER_SITE_OTHER): Payer: 59 | Admitting: Internal Medicine

## 2013-08-28 ENCOUNTER — Encounter: Payer: Self-pay | Admitting: Internal Medicine

## 2013-08-28 ENCOUNTER — Ambulatory Visit (INDEPENDENT_AMBULATORY_CARE_PROVIDER_SITE_OTHER)
Admission: RE | Admit: 2013-08-28 | Discharge: 2013-08-28 | Disposition: A | Discharge status: Home / Self Care | Payer: 59 | Source: Ambulatory Visit | Attending: Internal Medicine | Admitting: Internal Medicine

## 2013-08-28 VITALS — BP 134/70 | HR 80 | Temp 97.3°F | Resp 16 | Wt 147.0 lb

## 2013-08-28 DIAGNOSIS — K5900 Constipation, unspecified: Secondary | ICD-10-CM

## 2013-08-28 DIAGNOSIS — M4120 Other idiopathic scoliosis, site unspecified: Secondary | ICD-10-CM

## 2013-08-28 DIAGNOSIS — K219 Gastro-esophageal reflux disease without esophagitis: Secondary | ICD-10-CM

## 2013-08-28 DIAGNOSIS — R0789 Other chest pain: Secondary | ICD-10-CM

## 2013-08-28 MED ORDER — ESOMEPRAZOLE MAGNESIUM 40 MG PO CPDR: 40.0000 mg | DELAYED_RELEASE_CAPSULE | Freq: Two times a day (BID) | ORAL | Status: AC

## 2013-08-28 NOTE — Progress Notes (Signed)
Subjective:    Heartburn He complains of abdominal pain, belching, dysphagia and heartburn. He reports no chest pain, no coughing, no nausea, no sore throat or no wheezing. This is a recurrent problem. The current episode started 1 to 4 weeks ago. The problem occurs frequently. The problem has been gradually worsening. The heartburn duration is more than one hour. The heartburn is located in the substernum. The heartburn is of moderate intensity. The heartburn wakes him from sleep. The heartburn limits his activity. The heartburn doesn't change with position. Pertinent negatives include no fatigue.    C/o more GERD sx's after he switched from 2 nexiums to one a day F/u pain and discomfort w/swollowing food, vegetable juice - better  F/u severe L neck/shoulder pain x 3 wks after lifting a child - better now, however still hurts w/ROM - sharp pain...  The patient is here to follow up on chronic hemorrhoids, dyslipidemia and chronic moderate OA symptoms controlled with medicines some. C/o itching on back off and on - better. F/u constipation - better on Linzess. He has to sit for an hour to have a BM  Review of Systems  Constitutional: Negative for appetite change, fatigue and unexpected weight change.  HENT: Negative for congestion, nosebleeds, sneezing, sore throat and trouble swallowing.   Eyes: Negative for itching and visual disturbance.  Respiratory: Negative for cough and wheezing.   Cardiovascular: Negative for chest pain, palpitations and leg swelling.  Gastrointestinal: Positive for heartburn, dysphagia, abdominal pain and rectal pain. Negative for nausea, diarrhea, blood in stool, abdominal distention and anal bleeding.  Genitourinary: Negative for frequency and hematuria.  Musculoskeletal: Positive for arthralgias. Negative for back pain, gait problem, joint swelling and neck pain.  Skin: Negative for rash.  Neurological: Negative for dizziness, tremors, speech difficulty and  weakness.  Psychiatric/Behavioral: Negative for sleep disturbance, dysphoric mood and agitation. The patient is not nervous/anxious.        Objective:   Physical Exam  Constitutional: He is oriented to person, place, and time. He appears well-developed.  HENT:  Mouth/Throat: Oropharynx is clear and moist.  Eyes: Conjunctivae are normal. Pupils are equal, round, and reactive to light.  Neck: Normal range of motion. No JVD present. No thyromegaly present.  Cardiovascular: Normal rate, regular rhythm, normal heart sounds and intact distal pulses.  Exam reveals no gallop and no friction rub.   No murmur heard. Pulmonary/Chest: Effort normal and breath sounds normal. No respiratory distress. He has no wheezes. He has no rales. He exhibits no tenderness.  Abdominal: Soft. Bowel sounds are normal. He exhibits no distension and no mass. There is no tenderness. There is no rebound and no guarding.  Genitourinary:  declined  Musculoskeletal: Normal range of motion. He exhibits no edema and no tenderness.  Lymphadenopathy:    He has no cervical adenopathy.  Neurological: He is alert and oriented to person, place, and time. He has normal reflexes. No cranial nerve deficit. He exhibits normal muscle tone. Coordination normal.  Skin: Skin is warm and dry. No rash noted.  Psychiatric: He has a normal mood and affect. His behavior is normal. Judgment and thought content normal.  No rash Scoliosis is present Serial 7s w/errors Recalls 2.5/3 A/o/c   Lab Results  Component Value Date   WBC 8.7 08/15/2013   HGB 12.7* 08/15/2013   HCT 38.6* 08/15/2013   PLT 191.0 08/15/2013   GLUCOSE 87 08/15/2013   CHOL 166 05/09/2013   TRIG 76.0 05/09/2013   HDL 68.40 05/09/2013  LDLDIRECT 134.3 06/23/2009   LDLCALC 82 05/09/2013   ALT 15 08/15/2013   AST 19 08/15/2013   NA 138 08/15/2013   K 4.1 08/15/2013   CL 103 08/15/2013   CREATININE 0.8 08/15/2013   BUN 11 08/15/2013   CO2 28 08/15/2013   TSH 1.52  05/09/2013   PSA 6.17* 05/09/2013   INR 1.14 08/16/2010   HGBA1C 5.8 02/12/2008        Assessment & Plan:

## 2013-08-28 NOTE — Assessment & Plan Note (Signed)
1/15 worse on Nexium 40 mg/d (he was doing well on Nexium bid)

## 2013-08-28 NOTE — Assessment & Plan Note (Signed)
Continue with current prescription therapy as reflected on the Med list.  

## 2013-08-28 NOTE — Assessment & Plan Note (Signed)
1/15 likely GERD related CXR Treat GERD

## 2013-08-28 NOTE — Progress Notes (Signed)
Pre visit review using our clinic review tool, if applicable. No additional management support is needed unless otherwise documented below in the visit note. 

## 2013-09-12 ENCOUNTER — Ambulatory Visit: Payer: 59 | Admitting: Internal Medicine

## 2013-10-10 ENCOUNTER — Encounter: Payer: Self-pay | Admitting: Internal Medicine

## 2013-10-10 ENCOUNTER — Ambulatory Visit (INDEPENDENT_AMBULATORY_CARE_PROVIDER_SITE_OTHER): Payer: 59 | Admitting: Internal Medicine

## 2013-10-10 VITALS — BP 110/62 | HR 95 | Temp 98.5°F | Wt 138.0 lb

## 2013-10-10 DIAGNOSIS — R1310 Dysphagia, unspecified: Secondary | ICD-10-CM

## 2013-10-10 DIAGNOSIS — R0789 Other chest pain: Secondary | ICD-10-CM

## 2013-10-10 MED ORDER — SUCRALFATE 1 GM/10ML PO SUSP: 1.0000 g | Freq: Three times a day (TID) | ORAL | Status: DC

## 2013-10-10 MED ORDER — BENZONATATE 100 MG PO CAPS: 100.0000 mg | ORAL_CAPSULE | Freq: Two times a day (BID) | ORAL | Status: AC | PRN

## 2013-10-10 MED ORDER — CILIDINIUM-CHLORDIAZEPOXIDE 2.5-5 MG PO CAPS: 1.0000 | ORAL_CAPSULE | Freq: Three times a day (TID) | ORAL | Status: AC

## 2013-10-10 NOTE — Assessment & Plan Note (Signed)
Worse sx's CXR ok EKG Librax prn Added Carafate Kids vitamins chewable instead of occuvite GI consult

## 2013-10-10 NOTE — Progress Notes (Signed)
Subjective:   C/o chocking on food, V8 juce, pills C/o mucus, cough C/o L CP with swallowing x 2 mo - worse  Heartburn He complains of abdominal pain, belching, choking, dysphagia, heartburn and a hoarse voice. He reports no chest pain, no coughing, no nausea, no sore throat or no wheezing. This is a chronic problem. The current episode started more than 1 month ago. The problem occurs frequently. The problem has been gradually worsening. The heartburn duration is more than one hour. The heartburn is located in the substernum. The heartburn is of moderate intensity. The heartburn wakes him from sleep. The heartburn limits his activity. The heartburn doesn't change with position. Pertinent negatives include no fatigue.    The patient is here to follow up on chronic hemorrhoids, dyslipidemia and chronic moderate OA symptoms controlled with medicines some. F/u itching on back off and on - better. F/u constipation - better on Linzess. He has to sit for an hour to have a BM  Review of Systems  Constitutional: Negative for appetite change, fatigue and unexpected weight change.  HENT: Positive for hoarse voice. Negative for congestion, nosebleeds, sneezing, sore throat and trouble swallowing.   Eyes: Negative for itching and visual disturbance.  Respiratory: Positive for choking. Negative for cough and wheezing.   Cardiovascular: Negative for chest pain, palpitations and leg swelling.  Gastrointestinal: Positive for heartburn, dysphagia, abdominal pain and rectal pain. Negative for nausea, diarrhea, blood in stool, abdominal distention and anal bleeding.  Genitourinary: Negative for frequency and hematuria.  Musculoskeletal: Positive for arthralgias. Negative for back pain, gait problem, joint swelling and neck pain.  Skin: Negative for rash.  Neurological: Negative for dizziness, tremors, speech difficulty and weakness.  Psychiatric/Behavioral: Negative for suicidal ideas, sleep disturbance,  dysphoric mood and agitation. The patient is not nervous/anxious.        Objective:   Physical Exam  Constitutional: He is oriented to person, place, and time. He appears well-developed.  HENT:  Mouth/Throat: Oropharynx is clear and moist.  Eyes: Conjunctivae are normal. Pupils are equal, round, and reactive to light.  Neck: Normal range of motion. No JVD present. No thyromegaly present.  Cardiovascular: Normal rate, regular rhythm, normal heart sounds and intact distal pulses.  Exam reveals no gallop and no friction rub.   No murmur heard. Pulmonary/Chest: Effort normal and breath sounds normal. No respiratory distress. He has no wheezes. He has no rales. He exhibits no tenderness.  Abdominal: Soft. Bowel sounds are normal. He exhibits no distension and no mass. There is no tenderness. There is no rebound and no guarding.  Genitourinary:  declined  Musculoskeletal: Normal range of motion. He exhibits no edema and no tenderness.  Lymphadenopathy:    He has no cervical adenopathy.  Neurological: He is alert and oriented to person, place, and time. He has normal reflexes. No cranial nerve deficit. He exhibits normal muscle tone. Coordination normal.  Skin: Skin is warm and dry. No rash noted.  Psychiatric: His behavior is normal. Judgment and thought content normal.  Dysphoric   No rash Scoliosis is present Serial 7s w/errors Recalls 2.5/3 A/o/c   Lab Results  Component Value Date   WBC 8.7 08/15/2013   HGB 12.7* 08/15/2013   HCT 38.6* 08/15/2013   PLT 191.0 08/15/2013   GLUCOSE 87 08/15/2013   CHOL 166 05/09/2013   TRIG 76.0 05/09/2013   HDL 68.40 05/09/2013   LDLDIRECT 134.3 06/23/2009   LDLCALC 82 05/09/2013   ALT 15 08/15/2013   AST 19  08/15/2013   NA 138 08/15/2013   K 4.1 08/15/2013   CL 103 08/15/2013   CREATININE 0.8 08/15/2013   BUN 11 08/15/2013   CO2 28 08/15/2013   TSH 1.52 05/09/2013   PSA 6.17* 05/09/2013   INR 1.14 08/16/2010   HGBA1C 5.8 02/12/2008         Assessment & Plan:

## 2013-10-10 NOTE — Assessment & Plan Note (Signed)
CXR ok EKG Librax prn Added Carafate Kids vitamins chewable instead of occuvite GI consult

## 2013-10-10 NOTE — Patient Instructions (Signed)
  Added Carafate Kids vitamins chewable instead of occuvite GI consultation

## 2013-10-10 NOTE — Progress Notes (Signed)
Pre visit review using our clinic review tool, if applicable. No additional management support is needed unless otherwise documented below in the visit note. 

## 2013-10-23 ENCOUNTER — Telehealth: Payer: Self-pay | Admitting: Internal Medicine

## 2013-10-23 NOTE — Telephone Encounter (Signed)
10/23/13  Pt's son wants to speak with Dr. Alain Marion about fathers weight loss.

## 2013-10-23 NOTE — Telephone Encounter (Signed)
Spoke w/son: wt loss, indigestion. They have an appt w/Dr Deatra Ina tomorrow

## 2013-10-24 ENCOUNTER — Ambulatory Visit: Payer: 59 | Admitting: Internal Medicine

## 2013-10-24 ENCOUNTER — Telehealth: Payer: Self-pay | Admitting: *Deleted

## 2013-10-24 ENCOUNTER — Ambulatory Visit (INDEPENDENT_AMBULATORY_CARE_PROVIDER_SITE_OTHER): Payer: 59 | Admitting: Nurse Practitioner

## 2013-10-24 ENCOUNTER — Encounter: Payer: Self-pay | Admitting: Nurse Practitioner

## 2013-10-24 VITALS — BP 100/60 | HR 66 | Ht 65.0 in | Wt 137.4 lb

## 2013-10-24 DIAGNOSIS — R634 Abnormal weight loss: Secondary | ICD-10-CM

## 2013-10-24 DIAGNOSIS — R1310 Dysphagia, unspecified: Secondary | ICD-10-CM

## 2013-10-24 MED ORDER — SUCRALFATE 1 GM/10ML PO SUSP: ORAL | Status: DC

## 2013-10-24 NOTE — Progress Notes (Signed)
HPI :  Patient is a 80 year old male from [place], new to this practice, referred by PCP for evaluation GERD / dysphagia. Patient has a long-standing history of GERD, he has been on a daily PPI for several months.  Over the last couple of months patient has struggled with dysphagia, mainly to solids but sometimes with liquids as well. He reports weight loss but cannot give an amount. I see that his weight was 159 in October, it is 137 today.    Past Medical History  Diagnosis Date  . GERD (gastroesophageal reflux disease)   . Osteoarthritis   . PVD (peripheral vascular disease)   . Hemorrhoids   . Elevated PSA 2008  . Hyperlipidemia   . Knee pain     bilateral L>R    Family History  Problem Relation Age of Onset  . Heart disease Father 27    CAD  . Heart disease Mother   . Colon cancer Neg Hx    History  Substance Use Topics  . Smoking status: Former Research scientist (life sciences)  . Smokeless tobacco: Never Used  . Alcohol Use: No   Current Outpatient Prescriptions  Medication Sig Dispense Refill  . benzonatate (TESSALON) 100 MG capsule Take 1-2 capsules (100-200 mg total) by mouth 2 (two) times daily as needed for cough.  90 capsule  2  . Cholecalciferol (EQL VITAMIN D3) 1000 UNITS tablet Take 1,000 Units by mouth daily.        . clidinium-chlordiazePOXIDE (LIBRAX) 5-2.5 MG per capsule Take 1 capsule by mouth 3 (three) times daily before meals. Spasms in throat, chest  90 capsule  3  . diltiazem 2 % GEL Apply 1 application topically 2 (two) times daily.  30 g  11  . esomeprazole (NEXIUM) 40 MG capsule Take 1 capsule (40 mg total) by mouth 2 (two) times daily before a meal.  60 capsule  11  . ketoconazole (NIZORAL) 2 % cream APPLY TOPICALLY 2 (TWO) TIMES DAILY.  120 g  3  . Linaclotide (LINZESS) 290 MCG CAPS capsule TAKE 1 CAPSULE BY MOUTH DAILY  30 capsule  11  . multivitamin-lutein (OCUVITE-LUTEIN) CAPS Take 1 capsule by mouth daily.        Marland Kitchen senna-docusate (STOOL SOFTENER PLUS LAXATIVE)  8.6-50 MG per tablet Take 2 tablets by mouth 2 (two) times daily.        . traMADol (ULTRAM) 50 MG tablet Take 1-2 tablets (50-100 mg total) by mouth 2 (two) times daily as needed.  90 tablet  2  . triamcinolone cream (KENALOG) 0.1 % APPLY TOPICALLY 3 (THREE) TIMES DAILY.  120 g  3   No current facility-administered medications for this visit.   Allergies  Allergen Reactions  . Aricept [Donepezil Hcl]     ?problems  . Atorvastatin     REACTION: intolerance  . Crestor [Rosuvastatin]     abd pain  . Meloxicam     REACTION: edema    Review of Systems: All systems reviewed and negative except where noted in HPI.   Physical Exam: BP 100/60  Pulse 66  Ht 5\' 5"  (1.651 m)  Wt 137 lb 6.4 oz (62.324 kg)  BMI 22.86 kg/m2 Constitutional: Well-developed, white male in no acute distress. HEENT: Normocephalic and atraumatic. Conjunctivae are normal. No scleral icterus. Mouth: no obvious candida Neck supple.  Cardiovascular: Normal rate, regular rhythm.  Pulmonary/chest: Effort normal and breath sounds normal. No wheezing, rales or rhonchi. Abdominal: Soft, nondistended, nontender. Bowel sounds active throughout. There are no  masses palpable. No hepatomegaly. Upper midline surgical scar Extremities: no edema Lymphadenopathy: No cervical adenopathy noted. Neurological: Alert and oriented to person place and time. Musculoskeletal: scoliosis Skin: Skin is warm and dry. No rashes noted. Psychiatric: Normal mood and affect. Behavior is normal.   ASSESSMENT AND PLAN:  72. 80 year old male with 1-2 month history of dysphagia, mainly to solids. He has associated weight loss and complains of left sided chest pain. History is a little difficult to obtain given language barrier. His son helps with the history, the rest comes from [facility] records.  Rule out stricture, esophageal neoplasm. For further evaluation patient will be scheduled for EGD. The benefits, risks, and potential complications of EGD  with possible biopsies and/or dilation were discussed with the patient and he agrees to proceed. Trial of Carafate for chest discomfort. Patient inquiring about EKG results which I deferred to PCP. Of note, CXR late January was negative for acute abnormalities.   2. Remote history of PUD for which he apparently had gastric resection / vagotomy.   3. Remote history of a Nissen Fundoplication per EPIC  4. Scoliosis

## 2013-10-24 NOTE — Telephone Encounter (Signed)
Called Dr Plotnikov's office and explained to Triage , I left a message that this patient and Son were upset they did not get EKG results.  Dr. Alain Marion just spoke with the son yesterday 3-26  And he made the appt for the pt to be seen today . He saw Tye Savoy NP-C today and is having an EGD on 10-28-2013.

## 2013-10-24 NOTE — Patient Instructions (Addendum)
We sent a prescription for Carafate liquid to [facility].  You have been scheduled for an endoscopy with propofol. Please follow written instructions given to you at your visit today. If you use inhalers (even only as needed), please bring them with you on the day of your procedure.

## 2013-10-24 NOTE — Telephone Encounter (Signed)
Phone call from staff on 3 rd floor stating patient was seen today but upset he did not get his EKG results from yesterday.

## 2013-10-26 DIAGNOSIS — R634 Abnormal weight loss: Secondary | ICD-10-CM | POA: Insufficient documentation

## 2013-10-27 NOTE — Telephone Encounter (Signed)
Noted. Thx.

## 2013-10-28 ENCOUNTER — Telehealth: Payer: Self-pay

## 2013-10-28 ENCOUNTER — Encounter (HOSPITAL_COMMUNITY): Payer: Self-pay | Admitting: Gastroenterology

## 2013-10-28 ENCOUNTER — Encounter (HOSPITAL_COMMUNITY)
Admission: RE | Disposition: A | Discharge status: Home / Self Care | Payer: Self-pay | Source: Ambulatory Visit | Attending: Gastroenterology

## 2013-10-28 ENCOUNTER — Ambulatory Visit (HOSPITAL_COMMUNITY)
Admission: RE | Admit: 2013-10-28 | Discharge: 2013-10-28 | Disposition: A | Discharge status: Home / Self Care | Payer: PRIVATE HEALTH INSURANCE | Source: Ambulatory Visit | Attending: Gastroenterology | Admitting: Gastroenterology

## 2013-10-28 DIAGNOSIS — C154 Malignant neoplasm of middle third of esophagus: Secondary | ICD-10-CM | POA: Diagnosis not present

## 2013-10-28 DIAGNOSIS — M4120 Other idiopathic scoliosis, site unspecified: Secondary | ICD-10-CM | POA: Insufficient documentation

## 2013-10-28 DIAGNOSIS — Z8711 Personal history of peptic ulcer disease: Secondary | ICD-10-CM | POA: Insufficient documentation

## 2013-10-28 DIAGNOSIS — I739 Peripheral vascular disease, unspecified: Secondary | ICD-10-CM | POA: Diagnosis not present

## 2013-10-28 DIAGNOSIS — Z87891 Personal history of nicotine dependence: Secondary | ICD-10-CM | POA: Diagnosis not present

## 2013-10-28 DIAGNOSIS — Z79899 Other long term (current) drug therapy: Secondary | ICD-10-CM | POA: Insufficient documentation

## 2013-10-28 DIAGNOSIS — K219 Gastro-esophageal reflux disease without esophagitis: Secondary | ICD-10-CM | POA: Diagnosis not present

## 2013-10-28 DIAGNOSIS — E785 Hyperlipidemia, unspecified: Secondary | ICD-10-CM | POA: Diagnosis not present

## 2013-10-28 DIAGNOSIS — R1310 Dysphagia, unspecified: Secondary | ICD-10-CM | POA: Insufficient documentation

## 2013-10-28 DIAGNOSIS — R634 Abnormal weight loss: Secondary | ICD-10-CM

## 2013-10-28 DIAGNOSIS — C159 Malignant neoplasm of esophagus, unspecified: Secondary | ICD-10-CM

## 2013-10-28 HISTORY — PX: ESOPHAGOGASTRODUODENOSCOPY: SHX5428

## 2013-10-28 SURGERY — EGD (ESOPHAGOGASTRODUODENOSCOPY)
Anesthesia: Moderate Sedation

## 2013-10-28 MED ORDER — DIPHENHYDRAMINE HCL 50 MG/ML IJ SOLN
INTRAMUSCULAR | Status: AC
  Filled 2013-10-28: qty 1

## 2013-10-28 MED ORDER — FENTANYL CITRATE 0.05 MG/ML IJ SOLN
INTRAMUSCULAR | Status: AC
  Filled 2013-10-28: qty 2

## 2013-10-28 MED ORDER — MIDAZOLAM HCL 10 MG/2ML IJ SOLN
INTRAMUSCULAR | Status: DC | PRN
  Administered 2013-10-28: 1 mg via INTRAVENOUS
  Administered 2013-10-28 (×3): 2 mg via INTRAVENOUS

## 2013-10-28 MED ORDER — SODIUM CHLORIDE 0.9 % IV SOLN
INTRAVENOUS | Status: DC
  Administered 2013-10-28: 500 mL via INTRAVENOUS

## 2013-10-28 MED ORDER — FENTANYL CITRATE 0.05 MG/ML IJ SOLN
INTRAMUSCULAR | Status: DC | PRN
  Administered 2013-10-28 (×3): 25 ug via INTRAVENOUS

## 2013-10-28 MED ORDER — BUTAMBEN-TETRACAINE-BENZOCAINE 2-2-14 % EX AERO
INHALATION_SPRAY | CUTANEOUS | Status: DC | PRN
  Administered 2013-10-28: 2 via TOPICAL

## 2013-10-28 MED ORDER — MIDAZOLAM HCL 10 MG/2ML IJ SOLN
INTRAMUSCULAR | Status: AC
  Filled 2013-10-28: qty 4

## 2013-10-28 NOTE — Telephone Encounter (Addendum)
  You have been scheduled for a CT scan of the abdomen and pelvis at [facility] ([address]---this is in the same building as Press [facility]).   You are scheduled on 11/03/13 at 9 am. You should arrive 15 minutes prior to your appointment time for registration. Please follow the written instructions below on the day of your exam:  WARNING: IF YOU ARE ALLERGIC TO IODINE/X-RAY DYE, PLEASE NOTIFY RADIOLOGY IMMEDIATELY AT [phone]! YOU WILL BE GIVEN A 13 HOUR PREMEDICATION PREP.  1) Do not eat or drink anything after 5 am(4 hours prior to your test) 2) You have been given 2 bottles of oral contrast to drink. The solution may taste better if refrigerated, but do NOT add ice or any other liquid to this solution. Shake well before drinking.    Drink 1 bottle of contrast @  7 am (2 hours prior to your exam)  Drink 1 bottle of contrast @  8 am (1 hour prior to your exam)  You may take any medications as prescribed with a small amount of water except for the following: Metformin, Glucophage, Glucovance, Avandamet, Riomet, Fortamet, Actoplus Met, Janumet, Glumetza or Metaglip. The above medications must be held the day of the exam AND 48 hours after the exam.  The purpose of you drinking the oral contrast is to aid in the visualization of your intestinal tract. The contrast solution may cause some diarrhea. Before your exam is started, you will be given a small amount of fluid to drink. Depending on your individual set of symptoms, you may also receive an intravenous injection of x-ray contrast/dye. Plan on being at [facility] for 30 minutes or long, depending on the type of exam you are having performed.  If you have any questions regarding your exam or if you need to reschedule, you may call the CT department at [phone] between the hours of 8:00 am and 5:00 pm, Monday-Friday.   Pt will be called tomorrow and given instructions and inform pt to have labs prior.

## 2013-10-28 NOTE — Discharge Instructions (Signed)
YOU HAD AN ENDOSCOPIC PROCEDURE TODAY: Refer to the procedure report that was given to you for any specific questions about what was found during the examination.  If the procedure report does not answer your questions, please call your gastroenterologist to clarify.  YOU SHOULD EXPECT: Some feelings of bloating in the abdomen. Passage of more gas than usual.  Walking can help get rid of the air that was put into your GI tract during the procedure and reduce the bloating. If you had a lower endoscopy (such as a colonoscopy or flexible sigmoidoscopy) you may notice spotting of blood in your stool or on the toilet paper.   DIET: YOU SHOULD NOT EAT ANY FOOD THAT NEEDS TO BE CHEWED BEFORE SWALLOWING IT.  PUREE CONSISTENCY FOODS AT MOST.  TRY CARNATION INSTANT BREAKFAST POWDER IN WHOLE MILD 2-3 TIMES PER DAY.  ACTIVITY: Your care partner should take you home directly after the procedure.  You should plan to take it easy, moving slowly for the rest of the day.  You can resume normal activity the day after the procedure however you should NOT DRIVE or use heavy machinery for 24 hours (because of the sedation medicines used during the test).    SYMPTOMS TO REPORT IMMEDIATELY  A gastroenterologist can be reached at any hour.  Please call your doctor's office for any of the following symptoms:   Following lower endoscopy (colonoscopy, flexible sigmoidoscopy)  Excessive amounts of blood in the stool  Significant tenderness, worsening of abdominal pains  Swelling of the abdomen that is new, acute  Fever of 100 or higher  Following upper endoscopy (EGD, EUS, ERCP)  Vomiting of blood or coffee ground material  New, significant abdominal pain  New, significant chest pain or pain under the shoulder blades  Painful or persistently difficult swallowing  New shortness of breath  Black, tarry-looking stools  FOLLOW UP: If any biopsies were taken you will be contacted by phone or by letter within the next 1-3  weeks.  Call your gastroenterologist if you have not heard about the biopsies in 3 weeks.  Please also call your gastroenterologist's office with any specific questions about appointments or follow up tests.

## 2013-10-28 NOTE — Telephone Encounter (Signed)
Message copied by Barron Alvine on Tue Oct 28, 2013  1:54 PM ------      Message from: Milus Banister      Created: Tue Oct 28, 2013  1:27 PM       Just leaving from out patient EGD.  Should have full set of labs (cbc, cmet)            ----- Message -----         From: Barron Alvine, CMA         Sent: 10/28/2013   1:25 PM           To: Milus Banister, MD            How soon does the CT need to be I see the pt is currently admitted. Also, he will need a BUN and Creat will he have that done while admitted?       ------

## 2013-10-28 NOTE — H&P (View-Only) (Signed)
HPI :  Patient is a 80 year old male from [place], new to this practice, referred by PCP for evaluation GERD / dysphagia. Patient has a long-standing history of GERD, he has been on a daily PPI for several months.  Over the last couple of months patient has struggled with dysphagia, mainly to solids but sometimes with liquids as well. He reports weight loss but cannot give an amount. I see that his weight was 159 in October, it is 137 today.    Past Medical History  Diagnosis Date  . GERD (gastroesophageal reflux disease)   . Osteoarthritis   . PVD (peripheral vascular disease)   . Hemorrhoids   . Elevated PSA 2008  . Hyperlipidemia   . Knee pain     bilateral L>R    Family History  Problem Relation Age of Onset  . Heart disease Father 53    CAD  . Heart disease Mother   . Colon cancer Neg Hx    History  Substance Use Topics  . Smoking status: Former Research scientist (life sciences)  . Smokeless tobacco: Never Used  . Alcohol Use: No   Current Outpatient Prescriptions  Medication Sig Dispense Refill  . benzonatate (TESSALON) 100 MG capsule Take 1-2 capsules (100-200 mg total) by mouth 2 (two) times daily as needed for cough.  90 capsule  2  . Cholecalciferol (EQL VITAMIN D3) 1000 UNITS tablet Take 1,000 Units by mouth daily.        . clidinium-chlordiazePOXIDE (LIBRAX) 5-2.5 MG per capsule Take 1 capsule by mouth 3 (three) times daily before meals. Spasms in throat, chest  90 capsule  3  . diltiazem 2 % GEL Apply 1 application topically 2 (two) times daily.  30 g  11  . esomeprazole (NEXIUM) 40 MG capsule Take 1 capsule (40 mg total) by mouth 2 (two) times daily before a meal.  60 capsule  11  . ketoconazole (NIZORAL) 2 % cream APPLY TOPICALLY 2 (TWO) TIMES DAILY.  120 g  3  . Linaclotide (LINZESS) 290 MCG CAPS capsule TAKE 1 CAPSULE BY MOUTH DAILY  30 capsule  11  . multivitamin-lutein (OCUVITE-LUTEIN) CAPS Take 1 capsule by mouth daily.        Marland Kitchen senna-docusate (STOOL SOFTENER PLUS LAXATIVE)  8.6-50 MG per tablet Take 2 tablets by mouth 2 (two) times daily.        . traMADol (ULTRAM) 50 MG tablet Take 1-2 tablets (50-100 mg total) by mouth 2 (two) times daily as needed.  90 tablet  2  . triamcinolone cream (KENALOG) 0.1 % APPLY TOPICALLY 3 (THREE) TIMES DAILY.  120 g  3   No current facility-administered medications for this visit.   Allergies  Allergen Reactions  . Aricept [Donepezil Hcl]     ?problems  . Atorvastatin     REACTION: intolerance  . Crestor [Rosuvastatin]     abd pain  . Meloxicam     REACTION: edema    Review of Systems: All systems reviewed and negative except where noted in HPI.   Physical Exam: BP 100/60  Pulse 66  Ht 5\' 5"  (1.651 m)  Wt 137 lb 6.4 oz (62.324 kg)  BMI 22.86 kg/m2 Constitutional: Well-developed, white male in no acute distress. HEENT: Normocephalic and atraumatic. Conjunctivae are normal. No scleral icterus. Mouth: no obvious candida Neck supple.  Cardiovascular: Normal rate, regular rhythm.  Pulmonary/chest: Effort normal and breath sounds normal. No wheezing, rales or rhonchi. Abdominal: Soft, nondistended, nontender. Bowel sounds active throughout. There are no  masses palpable. No hepatomegaly. Upper midline surgical scar Extremities: no edema Lymphadenopathy: No cervical adenopathy noted. Neurological: Alert and oriented to person place and time. Musculoskeletal: scoliosis Skin: Skin is warm and dry. No rashes noted. Psychiatric: Normal mood and affect. Behavior is normal.   ASSESSMENT AND PLAN:  72. 80 year old male with 1-2 month history of dysphagia, mainly to solids. He has associated weight loss and complains of left sided chest pain. History is a little difficult to obtain given language barrier. His son helps with the history, the rest comes from [facility] records.  Rule out stricture, esophageal neoplasm. For further evaluation patient will be scheduled for EGD. The benefits, risks, and potential complications of EGD  with possible biopsies and/or dilation were discussed with the patient and he agrees to proceed. Trial of Carafate for chest discomfort. Patient inquiring about EKG results which I deferred to PCP. Of note, CXR late January was negative for acute abnormalities.   2. Remote history of PUD for which he apparently had gastric resection / vagotomy.   3. Remote history of a Nissen Fundoplication per EPIC  4. Scoliosis

## 2013-10-28 NOTE — Telephone Encounter (Signed)
Message copied by Barron Alvine on Tue Oct 28, 2013 12:26 PM ------      Message from: Milus Banister      Created: Tue Oct 28, 2013 12:18 PM       Kaylah Chiasson,      He needs CT scan chest/abdomen/pelvis  (iv and oral contrast) to stage newly diagnosed mid esohpagus cancer. Referral to medical oncology for the same.            Barnett Applebaum      He needs medical oncology visit, see above.  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            Omar Person  ------

## 2013-10-28 NOTE — Interval H&P Note (Signed)
History and Physical Interval Note:  10/28/2013 10:10 AM  Nathaniel Harvey  has presented today for surgery, with the diagnosis of Dysphagia 787.20 Weight loss 783.21  The various methods of treatment have been discussed with the patient and family. After consideration of risks, benefits and other options for treatment, the patient has consented to  Procedure(s): ESOPHAGOGASTRODUODENOSCOPY (EGD) WITH ESOPHAGEAL DILATION (N/A) as a surgical intervention .  The patient's history has been reviewed, patient examined, no change in status, stable for surgery.  I have reviewed the patient's chart and labs.  Questions were answered to the patient's satisfaction.     Milus Banister

## 2013-10-28 NOTE — Telephone Encounter (Signed)
Just leaving from out patient EGD. Should have full set of labs (cbc, cmet)

## 2013-10-28 NOTE — Op Note (Addendum)
[facility] [place], [address]   ENDOSCOPY PROCEDURE REPORT  PATIENT: [name], [name]  MR#: [id number] BIRTHDATE: 10/20/1933 , 79  yrs. old GENDER: Male ENDOSCOPIST: Milus Banister, MD REFERRED BY:  Creig Hines, M.D. PROCEDURE DATE:  10/28/2013 PROCEDURE:  EGD w/ biopsy ASA CLASS:     Class III INDICATIONS:  dysphagia, weight loss; remote gastric surgery (?fundoplication). MEDICATIONS: Fentanyl 75 mcg IV and Versed 7 mg IV TOPICAL ANESTHETIC: Cetacaine Spray DESCRIPTION OF PROCEDURE: After the risks benefits and alternatives of the procedure were thoroughly explained, informed consent was obtained.  The [place] [id number] endoscope was introduced through the mouth and advanced to the second portion of the duodenum. Without limitations.  The instrument was slowly withdrawn as the mucosa was fully examined.   There was a circumferential, clearly malignant, mid esophageal mass spanning from 29cm from incisors to 21 cm from incisors.  There was about 5-6cm of normal mucosa proximal to the mass and 6-8cm of normal mucosa distal to the mass.  The mass causes malignant stricture which was partially but not completely obstructive.  The lumen through the mass is approximately 48mm across, able to be traversed easily with adult gastroscope.  The anatomy of proximal stomach was quite unusual, appears to be from previous fundoplication surgery.  The examination was otherwise normal. Retroflexed views revealed no abnormalities.     The scope was then withdrawn from the patient and the procedure completed. COMPLICATIONS: There were no complications.  ENDOSCOPIC IMPRESSION: 8 cm long, mid esophagus malignancy (biopsies pending but I suspect adenocarcinoma).  RECOMMENDATIONS: My office will arrange staging workup (chest/abd/pelvic CT with IV and oral contrast) for newly diagnosed esophageal cancer. Will also set up referral to medical  oncology.  You should not try eating solid food for now.  Full liquids to pureed foods at most. [employer] 2-3 times per day would be helpful.   eSigned:  Milus Banister, MD 10/28/2013 12:15 PM    PATIENT NAME:  Nathaniel Harvey, Nathaniel Harvey MR#: [id number]

## 2013-10-29 ENCOUNTER — Encounter (HOSPITAL_COMMUNITY): Payer: Self-pay | Admitting: Gastroenterology

## 2013-10-29 ENCOUNTER — Other Ambulatory Visit (INDEPENDENT_AMBULATORY_CARE_PROVIDER_SITE_OTHER): Payer: 59

## 2013-10-29 DIAGNOSIS — C154 Malignant neoplasm of middle third of esophagus: Secondary | ICD-10-CM

## 2013-10-29 LAB — COMPREHENSIVE METABOLIC PANEL
ALT: 14 U/L (ref 0–53)
AST: 16 U/L (ref 0–37)
Albumin: 2.5 g/dL — ABNORMAL LOW (ref 3.5–5.2)
Alkaline Phosphatase: 50 U/L (ref 39–117)
BUN: 8 mg/dL (ref 6–23)
CO2: 30 mEq/L (ref 19–32)
CREATININE: 0.7 mg/dL (ref 0.4–1.5)
Calcium: 8.9 mg/dL (ref 8.4–10.5)
Chloride: 99 mEq/L (ref 96–112)
GFR: 114.1 mL/min (ref >60.00)
Glucose, Bld: 115 mg/dL — ABNORMAL HIGH (ref 70–99)
Potassium: 3.8 mEq/L (ref 3.5–5.1)
Sodium: 135 mEq/L (ref 135–145)
Total Bilirubin: 0.9 mg/dL (ref 0.3–1.2)
Total Protein: 6.1 g/dL (ref 6.0–8.3)

## 2013-10-29 LAB — CBC WITH DIFFERENTIAL/PLATELET
Basophils Absolute: 0 10*3/uL (ref 0.0–0.1)
Basophils Relative: 0.1 % (ref 0.0–3.0)
EOS ABS: 0 10*3/uL (ref 0.0–0.7)
Eosinophils Relative: 0.3 % (ref 0.0–5.0)
HEMATOCRIT: 32.7 % — AB (ref 39.0–52.0)
HEMOGLOBIN: 10.8 g/dL — AB (ref 13.0–17.0)
LYMPHS ABS: 1.3 10*3/uL (ref 0.7–4.0)
LYMPHS PCT: 13.2 % (ref 12.0–46.0)
MCHC: 33.1 g/dL (ref 30.0–36.0)
MCV: 85.6 fl (ref 78.0–100.0)
Monocytes Absolute: 0.9 10*3/uL (ref 0.1–1.0)
Monocytes Relative: 9.3 % (ref 3.0–12.0)
NEUTROS ABS: 7.7 10*3/uL (ref 1.4–7.7)
Neutrophils Relative %: 77.1 % — ABNORMAL HIGH (ref 43.0–77.0)
Platelets: 415 10*3/uL — ABNORMAL HIGH (ref 150.0–400.0)
RBC: 3.82 Mil/uL — ABNORMAL LOW (ref 4.22–5.81)
RDW: 14.2 % (ref 11.5–14.6)
WBC: 10 10*3/uL (ref 4.5–10.5)

## 2013-10-29 NOTE — Telephone Encounter (Signed)
I spoke with the pt's son and he is aware to bring his father to the office today for labs and pick up contrast

## 2013-10-29 NOTE — Telephone Encounter (Signed)
Pt has been given the appt date and time for CT as well as labs, I am unsure if he understood due to the language barrier.  I had him repeat the instructions to confirm.  His son was also called to confirm information.  Left message on machine to call back

## 2013-10-30 ENCOUNTER — Telehealth: Payer: Self-pay | Admitting: *Deleted

## 2013-10-30 NOTE — Telephone Encounter (Signed)
Spoke with patient's son and gave appointment with Dr. Benay Spice for 11/06/13.  Contact names, numbers, and directions were provided.

## 2013-11-03 ENCOUNTER — Ambulatory Visit
Admit: 2013-11-03 | Discharge: 2013-11-03 | Disposition: A | Discharge status: Home / Self Care | Payer: 59 | Attending: Gastroenterology | Admitting: Gastroenterology

## 2013-11-03 ENCOUNTER — Ambulatory Visit (INDEPENDENT_AMBULATORY_CARE_PROVIDER_SITE_OTHER)
Admit: 2013-11-03 | Discharge: 2013-11-03 | Disposition: A | Discharge status: Home / Self Care | Payer: 59 | Attending: Gastroenterology | Admitting: Gastroenterology

## 2013-11-03 DIAGNOSIS — C154 Malignant neoplasm of middle third of esophagus: Secondary | ICD-10-CM

## 2013-11-03 MED ORDER — IOHEXOL 300 MG/ML  SOLN
100.0000 mL | Freq: Once | INTRAMUSCULAR | Status: AC | PRN
  Administered 2013-11-03: 100 mL via INTRAVENOUS

## 2013-11-04 ENCOUNTER — Telehealth: Payer: Self-pay | Admitting: Oncology

## 2013-11-04 ENCOUNTER — Other Ambulatory Visit: Payer: Self-pay | Admitting: *Deleted

## 2013-11-04 ENCOUNTER — Encounter: Payer: Self-pay | Admitting: Internal Medicine

## 2013-11-04 ENCOUNTER — Ambulatory Visit (INDEPENDENT_AMBULATORY_CARE_PROVIDER_SITE_OTHER): Payer: 59 | Admitting: Internal Medicine

## 2013-11-04 VITALS — BP 100/60 | HR 109 | Temp 98.5°F | Wt 133.8 lb

## 2013-11-04 DIAGNOSIS — C159 Malignant neoplasm of esophagus, unspecified: Secondary | ICD-10-CM

## 2013-11-04 DIAGNOSIS — K5900 Constipation, unspecified: Secondary | ICD-10-CM

## 2013-11-04 DIAGNOSIS — R0789 Other chest pain: Secondary | ICD-10-CM

## 2013-11-04 MED ORDER — LINACLOTIDE 145 MCG PO CAPS: 145.0000 ug | ORAL_CAPSULE | Freq: Every day | ORAL | Status: AC | PRN

## 2013-11-04 MED ORDER — LINACLOTIDE 145 MCG PO CAPS: 145.0000 ug | ORAL_CAPSULE | Freq: Every day | ORAL | Status: DC | PRN

## 2013-11-04 NOTE — Assessment & Plan Note (Signed)
4/15 likely met cancer related Tramadol prn

## 2013-11-04 NOTE — Assessment & Plan Note (Signed)
4/15 metastatic Dr Ardis Hughs Onc appt is pending w/Dr Benay Spice Discussed He will use Boost Will try to get him help (HHA) at home

## 2013-11-04 NOTE — Progress Notes (Signed)
Subjective:  He was worked in tidayF/u  Recent dx of met esoph ca F/u chocking on food, V8 juce, pills C/o mucus, cough C/o L CP with swallowing x 2 mo   Wt Readings from Last 3 Encounters:  11/04/13 133 lb 12 oz (60.669 kg)  10/24/13 137 lb 6.4 oz (62.324 kg)  10/10/13 138 lb (62.596 kg)     Heartburn He complains of abdominal pain, belching, choking, dysphagia, a hoarse voice and nausea. He reports no chest pain, no coughing, no heartburn, no sore throat or no wheezing. This is a chronic problem. The current episode started more than 1 month ago. The problem occurs frequently. The problem has been gradually worsening. The heartburn duration is more than one hour. The heartburn is located in the substernum. The heartburn is of moderate intensity. The heartburn wakes him from sleep. The heartburn limits his activity. The heartburn doesn't change with position. Associated symptoms include fatigue and weight loss. The treatment provided no relief.    The patient is here to follow up on chronic hemorrhoids, dyslipidemia and chronic moderate OA symptoms controlled with medicines some. F/u itching on back off and on - better. F/u constipation - better on Linzess. He has to sit for an hour to have a BM  Review of Systems  Constitutional: Positive for weight loss and fatigue. Negative for appetite change and unexpected weight change.  HENT: Positive for hoarse voice. Negative for congestion, nosebleeds, sneezing, sore throat and trouble swallowing.   Eyes: Negative for itching and visual disturbance.  Respiratory: Positive for choking and chest tightness. Negative for cough and wheezing.   Cardiovascular: Negative for chest pain, palpitations and leg swelling.  Gastrointestinal: Positive for dysphagia, nausea, vomiting, abdominal pain, constipation and rectal pain. Negative for heartburn, diarrhea, blood in stool, abdominal distention and anal bleeding.  Genitourinary: Negative for frequency  and hematuria.  Musculoskeletal: Positive for arthralgias. Negative for back pain, gait problem, joint swelling and neck pain.  Skin: Negative for rash.  Neurological: Positive for weakness. Negative for dizziness, tremors and speech difficulty.  Psychiatric/Behavioral: Positive for decreased concentration. Negative for suicidal ideas, sleep disturbance, dysphoric mood and agitation. The patient is nervous/anxious.        Objective:   Physical Exam  Constitutional: He is oriented to person, place, and time. He appears well-developed. He appears distressed.  Appears tired  HENT:  Mouth/Throat: Oropharynx is clear and moist.  Eyes: Conjunctivae are normal. Pupils are equal, round, and reactive to light.  Neck: Normal range of motion. No JVD present. No thyromegaly present.  Cardiovascular: Normal rate, regular rhythm, normal heart sounds and intact distal pulses.  Exam reveals no gallop and no friction rub.   No murmur heard. Pulmonary/Chest: Effort normal and breath sounds normal. No respiratory distress. He has no wheezes. He has no rales. He exhibits no tenderness.  Abdominal: Soft. Bowel sounds are normal. He exhibits no distension and no mass. There is no tenderness. There is no rebound and no guarding.  Genitourinary:  declined  Musculoskeletal: Normal range of motion. He exhibits no edema and no tenderness.  Lymphadenopathy:    He has no cervical adenopathy.  Neurological: He is alert and oriented to person, place, and time. He has normal reflexes. No cranial nerve deficit. He exhibits normal muscle tone. Coordination normal.  Skin: Skin is warm and dry. No rash noted.  Psychiatric: His behavior is normal. Judgment and thought content normal.  Dysphoric and depressed  No rash Scoliosis is present Serial 7s  w/errors Recalls 2.5/3 A/o/c   Lab Results  Component Value Date   WBC 10.0 10/29/2013   HGB 10.8* 10/29/2013   HCT 32.7* 10/29/2013   PLT 415.0* 10/29/2013   GLUCOSE 115*  10/29/2013   CHOL 166 05/09/2013   TRIG 76.0 05/09/2013   HDL 68.40 05/09/2013   LDLDIRECT 134.3 06/23/2009   LDLCALC 82 05/09/2013   ALT 14 10/29/2013   AST 16 10/29/2013   NA 135 10/29/2013   K 3.8 10/29/2013   CL 99 10/29/2013   CREATININE 0.7 10/29/2013   BUN 8 10/29/2013   CO2 30 10/29/2013   TSH 1.52 05/09/2013   PSA 6.17* 05/09/2013   INR 1.14 08/16/2010   HGBA1C 5.8 02/12/2008    EGD Bx CT abd/chest    Assessment & Plan:

## 2013-11-04 NOTE — Telephone Encounter (Signed)
S/w the pt's son and he is aware of the appt for the nutritionist on 11/07/2013@12 :noon

## 2013-11-04 NOTE — Progress Notes (Signed)
Pre visit review using our clinic review tool, if applicable. No additional management support is needed unless otherwise documented below in the visit note. 

## 2013-11-04 NOTE — Assessment & Plan Note (Signed)
Linzess - dose decreased

## 2013-11-05 ENCOUNTER — Ambulatory Visit: Payer: 59 | Admitting: Internal Medicine

## 2013-11-06 ENCOUNTER — Ambulatory Visit: Payer: 59 | Admitting: Internal Medicine

## 2013-11-06 ENCOUNTER — Ambulatory Visit: Payer: PRIVATE HEALTH INSURANCE | Admitting: Radiation Oncology

## 2013-11-06 ENCOUNTER — Ambulatory Visit
Admission: RE | Admit: 2013-11-06 | Discharge: 2013-11-06 | Disposition: A | Discharge status: Home / Self Care | Payer: PRIVATE HEALTH INSURANCE | Source: Ambulatory Visit

## 2013-11-06 ENCOUNTER — Encounter: Payer: Self-pay | Admitting: Oncology

## 2013-11-06 ENCOUNTER — Telehealth: Payer: Self-pay | Admitting: Oncology

## 2013-11-06 ENCOUNTER — Ambulatory Visit (HOSPITAL_BASED_OUTPATIENT_CLINIC_OR_DEPARTMENT_OTHER): Payer: 59 | Admitting: Oncology

## 2013-11-06 ENCOUNTER — Ambulatory Visit: Payer: PRIVATE HEALTH INSURANCE | Admitting: Nutrition

## 2013-11-06 ENCOUNTER — Ambulatory Visit
Admission: RE | Admit: 2013-11-06 | Discharge: 2013-11-06 | Disposition: A | Discharge status: Home / Self Care | Payer: PRIVATE HEALTH INSURANCE | Source: Ambulatory Visit | Attending: Radiation Oncology | Admitting: Radiation Oncology

## 2013-11-06 ENCOUNTER — Ambulatory Visit: Payer: PRIVATE HEALTH INSURANCE

## 2013-11-06 VITALS — BP 102/56 | HR 113 | Temp 97.9°F | Resp 18 | Ht 65.0 in | Wt 131.9 lb

## 2013-11-06 DIAGNOSIS — K219 Gastro-esophageal reflux disease without esophagitis: Secondary | ICD-10-CM | POA: Insufficient documentation

## 2013-11-06 DIAGNOSIS — C159 Malignant neoplasm of esophagus, unspecified: Secondary | ICD-10-CM

## 2013-11-06 DIAGNOSIS — E785 Hyperlipidemia, unspecified: Secondary | ICD-10-CM | POA: Insufficient documentation

## 2013-11-06 DIAGNOSIS — Z510 Encounter for antineoplastic radiation therapy: Secondary | ICD-10-CM | POA: Insufficient documentation

## 2013-11-06 DIAGNOSIS — R918 Other nonspecific abnormal finding of lung field: Secondary | ICD-10-CM

## 2013-11-06 DIAGNOSIS — R599 Enlarged lymph nodes, unspecified: Secondary | ICD-10-CM

## 2013-11-06 DIAGNOSIS — C155 Malignant neoplasm of lower third of esophagus: Secondary | ICD-10-CM | POA: Insufficient documentation

## 2013-11-06 DIAGNOSIS — Z96659 Presence of unspecified artificial knee joint: Secondary | ICD-10-CM | POA: Insufficient documentation

## 2013-11-06 DIAGNOSIS — Z87891 Personal history of nicotine dependence: Secondary | ICD-10-CM | POA: Insufficient documentation

## 2013-11-06 DIAGNOSIS — R1310 Dysphagia, unspecified: Secondary | ICD-10-CM

## 2013-11-06 DIAGNOSIS — R079 Chest pain, unspecified: Secondary | ICD-10-CM | POA: Insufficient documentation

## 2013-11-06 DIAGNOSIS — R634 Abnormal weight loss: Secondary | ICD-10-CM

## 2013-11-06 DIAGNOSIS — Z79899 Other long term (current) drug therapy: Secondary | ICD-10-CM | POA: Insufficient documentation

## 2013-11-06 DIAGNOSIS — R197 Diarrhea, unspecified: Secondary | ICD-10-CM | POA: Insufficient documentation

## 2013-11-06 DIAGNOSIS — I739 Peripheral vascular disease, unspecified: Secondary | ICD-10-CM | POA: Insufficient documentation

## 2013-11-06 NOTE — Progress Notes (Signed)
I met with patient, son, and interpreter.  Patient is a 80 year old male diagnosed with metastatic esophageal cancer.  He is a patient of Dr. Benay Spice.  Past medical history includes GERD, osteoporosis, PVD, and hyperlipidemia.  Medications include Nexium, multivitamin, Carafate, vitamin D3, and stool softener.  Labs include glucose of 115, and albumin 2.5 on April 1.  Height: 65 inches. Weight: 131.9 pounds on April 9. Usual body weight: 159 pounds October 2014. BMI: 21.95.  Patient has been having difficulty swallowing for several months.  He is able to consume thin liquids.  However, it takes a long time for him to swallow, even liquids.  He is sensitive to food temperatures.  He is unable to tolerate solid foods.  Patient complains of pain that is uncontrolled.  He does not like boost but agrees to try Ensure Plus.  Patient would not try oral nutrition supplements while he was here for his appointments today.  He has not had anything to eat today and complains about fatigue.  He is adamant about trying oral nutrition supplements at home.  Patient lives by himself but his son is supportive.  Nutrition-focused physical exam was deferred.  Estimated nutrition needs: 1800-2085 calories, 89-105 g protein, 2 L fluid.  Nutrition diagnosis: Unintended weight loss related to dysphasia secondary to metastatic esophageal cancer as evidenced by 17% weight loss over the last 6 months.  Patient meets criteria for severe malnutrition in the context of acute illness secondary to greater than 7-1/2% weight loss over 3 months and less than 50% of energy intake for greater than 5 days.  Intervention: Patient educated to consume a minimum of 5 bottles of Ensure Plus daily, to meet greater than 90% of estimated caloric needs.  Patient was educated on ways to thin product down for easier swallowing.  I also educated patient on blenderizing foods.  I reviewed high protein foods which the patient has consumed in  the past.  I provided patient with samples of oral nutrition supplements along with one complementary case of Ensure Plus.  Patient was appreciative of samples.  Monitoring, evaluation, goals: It is unlikely patient will be able to consume adequate nutrition or hydration without nutrition support.  If patient is to receive aggressive treatments, recommend feeding tube placement.  Questionable feeding jejunostomy versus gastrostomy tube based on plan of care.  Next visit: Thursday, April 16, after M.D. appointment.

## 2013-11-06 NOTE — Telephone Encounter (Signed)
Rad scheduling to set up the pt's pet scan appt

## 2013-11-06 NOTE — Telephone Encounter (Signed)
Gave pt appt for lab,md and radiation appt for today

## 2013-11-06 NOTE — Progress Notes (Signed)
Thoracic Location of Tumor / Histology: Esophagus mid  5.2x5.9x9.2cm   Patient presented  months ago with symptoms of: coughing, choking on foods,mucous 2 months, difficulty swallowing x 2 months worse  Biopsies of  (if applicable) revealed: Diagnosis 10/28/13:Esophagus, biopsy- POORLY DIFFERENTIATED HIGH GRADE MALIGNANCY, SEE COMMENT. Dr. Owens Loffler  Tobacco/Marijuana/Snuff/ETOH use: former smoker, no alcohol or illicit drug, no smokeless tobacco  Past/Anticipated interventions by cardiothoracic surgery, if any: no  Past/Anticipated interventions by medical oncology, if any: seen Dr.Sherrill 11/06/13, Ernestene Kiel , dietician today as well,   Signs/Symptoms  Weight changes, if any: yes 159 October 2014, 131 today=28lb loss  Respiratory complaints, if any:   Hemoptysis, if any:  Pain issues, if any:  3/10 right side chest,  Too painful when swallowing stated  SAFETY ISSUES:  Prior radiation? No  Pacemaker/ICD? NoNo  Possible current pregnancy?   Is the patient on methotrexate? No Changes, c/o: , [place] ,  hx PUD had gastric resection/vagotomy 06/1997;long hx GERD father heart disease, CAD,Mother Heart disease, ,  Fatigue Todays vitals in Dr.Dherrill's office=97.9, 102/56, P=113, weight =131lb,  Interpreter A lfia  Danielson in with patient and son

## 2013-11-06 NOTE — Progress Notes (Signed)
Patient was too tired to have consultation today , will see Dr.wentworth on April 14,2015.

## 2013-11-06 NOTE — Progress Notes (Signed)
[name] Consult   Referring MD: Hernandez Losasso 80 y.o.  10-Oct-1933    Reason for Referral: Esophagus cancer    HPI: Nathaniel Harvey presents today with his son and an interpreter. He speaks very little [place]. He has a 3 to four-month history of progressive solid and liquid dysphasia. He also complains of pain with swallowing. He was referred to Dr. Ardis Hughs and was taken to an upper endoscopy 10/28/2013. A circumferential mid esophageal mass was noted from 21-29 cm from the incisors. The adult gastroscope could traverse the mass. The stomach anatomy appeared to be from a previous fundoplication surgery. The mass was biopsied and the pathology ([id number]) revealed a poorly differentiated high-grade malignancy. Adjacent fragments of gastric mucosa with intestinal metaplasia was noted. The immunohistochemical profile was nonspecific.  He was referred for CTs of the chest, abdomen, and pelvis on 11/03/2013. A mid esophagus mass measured 5.2 x 5.9 x 9.2 cm and was noted to exert mass effect on the airway. The proximal esophagus was dilated. Contrast passed into the upper GI tract. Mediastinal adenopathy was noted in the high right paratracheal, AP window, and subcarinal areas. Tiny bilateral pleural effusions. Scattered pulmonary nodules were seen.. Some of the nodules were noted on a CT in January 2012, others are new. Paraseptal emphysema. Focal area of spiculation in the left upper lobe apex is unchanged. A cyst and too small to characterize lesions were noted in the liver. Masslike density in the proximal descending colon. Area of ill-defined low attenuation in the (aortic station measured 1.4 x 2 cm. The prostate is enlarged.  He reports being able to tolerate liquids.    Past Medical History  Diagnosis Date  . GERD (gastroesophageal reflux disease)   . Osteoarthritis   . PVD (peripheral vascular disease)   . Hemorrhoids   . Elevated PSA 2008    . Hyperlipidemia   . Knee pain     bilateral L>R    Past Surgical History  Procedure Laterality Date  . Vagotomy  12-98  . Nissen fundoplication modification    . Foot fracture surgery      right  . Knee torn ligament repair  1991    right  . Lipoma removal on back   2010  . Hernia repair    . Joint replacement  2012    R knee  . Esophagogastroduodenoscopy N/A 10/28/2013    Procedure: ESOPHAGOGASTRODUODENOSCOPY (EGD);  Surgeon: Milus Banister, MD;  Location: [facility];  Service: Endoscopy;  Laterality: N/A;    Medications: Reviewed  Allergies:  Allergies  Allergen Reactions  . Aricept [Donepezil Hcl]     ?problems  . Atorvastatin     REACTION: intolerance  . Crestor [Rosuvastatin]     abd pain  . Meloxicam     REACTION: edema    Family history: One brother and 2 sisters. No family history of cancer.  Social History:   He lives alone in [place]. He is retired. 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 He quit smoking cigarettes in 1990. No alcohol use. No risk factor for HIV or hepatitis.    ROS:   Positives include: Cough a few weeks ago that has resolved, constipation, solid and liquid dysphasia, pain with swallowing, 30 pound weight loss  A complete ROS was otherwise negative.  Physical Exam:  Blood pressure 102/56, pulse 113, temperature 97.9 F (36.6 C), temperature source Oral, resp. rate 18, height 5'  5" (1.651 m), weight 131 lb 14.4 oz (59.829 kg).  HEENT: Thick whitecoat over the tongue, no buccal thrush, oropharynx without visible mass, neck without mass Lungs: Clear bilaterally, distant breath sounds Cardiac: Regular rate and rhythm Abdomen: No hepatomegaly, nontender, no mass GU: Testes without mass  Vascular: No leg edema Lymph nodes: No cervical, supraclavicular, axillary, or inguinal nodes Neurologic: Alert and oriented, the motor exam appears intact in the upper and lower extremities Skin: No rash, abdominal  scar Musculoskeletal: No spine tenderness, marked kyphosis   LAB:  CBC  Lab Results  Component Value Date   WBC 10.0 10/29/2013   HGB 10.8* 10/29/2013   HCT 32.7* 10/29/2013   MCV 85.6 10/29/2013   PLT 415.0* 10/29/2013   NEUTROABS 7.7 10/29/2013     CMP      Component Value Date/Time   NA 135 10/29/2013 1306   K 3.8 10/29/2013 1306   CL 99 10/29/2013 1306   CO2 30 10/29/2013 1306   GLUCOSE 115* 10/29/2013 1306   GLUCOSE 92 05/21/2006 0808   BUN 8 10/29/2013 1306   CREATININE 0.7 10/29/2013 1306   CALCIUM 8.9 10/29/2013 1306   PROT 6.1 10/29/2013 1306   ALBUMIN 2.5* 10/29/2013 1306   AST 16 10/29/2013 1306   ALT 14 10/29/2013 1306   ALKPHOS 50 10/29/2013 1306   BILITOT 0.9 10/29/2013 1306   GFRNONAA >60 08/25/2010 0545   GFRAA  Value: >60        The eGFR has been calculated using the MDRD equation. This calculation has not been validated in all clinical situations. eGFR's persistently <60 mL/min signify possible Chronic Kidney Disease. 08/25/2010 0545    Imaging:  I reviewed the CT images from 11/03/2013-there is an esophageal mass, mediastinal adenopathy, and suspicious lung nodules. The patient did not wish to review the CT images.   Assessment/Plan:   1. Esophagus cancer-status post an upper endoscopy 10/28/2013 confirming an obstructing mid esophageal mass with a biopsy confirming a poorly differentiated high-grade malignancy  Staging CT scans 11/03/2013 revealed an esophagus mass, mediastinal adenopathy, and suspicious pulmonary nodules 2. Solid/liquid dysphasia and odynophagia secondary to #1  3.    weight loss  4.    possible descending colon mass on the CT 11/03/2013   Disposition:   He has been diagnosed with esophagus cancer. The immunohistochemical stains on the EGD biopsy could not confirm a specific diagnosis of squamous cell carcinoma or adenocarcinoma. He very likely has a primary esophagus cancer. We will present his case at the GI tumor conference to discuss the indication for  additional pathologic studies or a second biopsy.   I am concerned he has metastatic disease based on the locally advanced esophagus tumor, chest adenopathy, and suspicious lung nodules. He will be referred for a staging PET scan.  I contacted Dr. Pablo Ledger and she graciously agreed to see Mr. Kirker today. He will return for an office visit here after the PET scan.  If he has metastatic disease I am inclined to recommend a course of palliative radiation to be followed by systemic therapy.  We discussed placement of a feeding tube given his weight loss and the severe dysphagia. He stated that he will decline this. He also declined hospital admission. He met with the [place] nutritionist to discuss a liquid diet.  He will return for an office visit here in one week.  Ladell Pier 11/06/2013, 1:41 PM

## 2013-11-06 NOTE — Progress Notes (Signed)
Checked in new pt with no financial concerns. °

## 2013-11-07 ENCOUNTER — Encounter: Payer: 59 | Admitting: Nutrition

## 2013-11-07 NOTE — Progress Notes (Signed)
Radiation Oncology         [phone]) [phone] ________________________________  Initial outpatient Consultation - Date: 11/06/2013   Name: Nathaniel Harvey MRN: [id number]   DOB: Oct 16, 1933  REFERRING PHYSICIAN: Plotnikov, Georgina Quint, MD  DIAGNOSIS:  1. Esophageal cancer   2. Malignant neoplasm of lower third of esophagus     STAGE: Esophageal cancer   Primary site: Esophagus - Adenocarcinoma   Staging method: AJCC 7th Edition   Clinical free text: stage 4   Clinical: Stage IV (TX, N2, M1) signed by Ladene Artist, MD on 11/06/2013  6:38 PM   Summary: Stage IV (TX, N2, M1)   General comments: Suspicious lung nodules   HISTORY OF PRESENT ILLNESS::Nathaniel Harvey is a 80 y.o. male  with a 3 month history of solid and liquid dysphasia. He is accompanied by his son and an interpreter. I should note that he is really inpatient wanting to leave. The majority of this history comes from the interpreter as well as his medical oncology consultation note. He has also developed increasing amount of odynophagia. He is really only to able to swallow small sips and a time. He also had about a 30 pound weight loss. He was referred for endoscopy and had this performed on March 31. The circumferential mid esophageal mass was noted 21-29 cm from the incisors. This mass was biopsied and the pathology revealed poorly differentiated high-grade malignancy. Adjacent fragments of gastric mucosa with intestinal metaplasia and Barrett's was noted. The immunohistochemical profile was nonspecific for squamous versus adenocarcinoma. A CT of the chest abdomen and pelvis was performed on 11/03/2013. This showed a mid-esophageal mass measuring 5.2 x 5.9 x 9.2 cm and was noted to have mass effect on his trachea. Mediastinal adenopathy was also noted in the right paratracheal, AP window and subcarinal areas. Bilateral pleural effusions were noted as well as scattered pulmonary nodules. Some nodules were present in a CT from 2012. Others  were new. Too small to characterize lesions were noted in the liver. A masslike density was seen in the descending colon as well as an ill-defined area of low attenuation in the left para-aortic station measuring 1.4 x 2.0 cm. A masslike density in the colon that was possibly stool. Clinical correlation was recommended. He was therefore referred to Dr. Truett Perna who saw him this morning. Because of his significant weight loss and dysphasia he would like to you treatment started as soon as possible and referred him to me this afternoon for consideration of radiation in the management of his disease. He has met with the dietitian. He is retired from being a Veterinary surgeon. He smoked until 1990. He denies any alcohol use.  PREVIOUS RADIATION THERAPY: No  PAST MEDICAL HISTORY:  has a past medical history of GERD (gastroesophageal reflux disease); Osteoarthritis; PVD (peripheral vascular disease); Hemorrhoids; Elevated PSA (2008); Hyperlipidemia; and Knee pain.    PAST SURGICAL HISTORY: Past Surgical History  Procedure Laterality Date  . Vagotomy  12-98  . Nissen fundoplication modification    . Foot fracture surgery      right  . Knee torn ligament repair  1991    right  . Lipoma removal on back   2010  . Hernia repair    . Joint replacement  2012    R knee  . Esophagogastroduodenoscopy N/A 10/28/2013    Procedure: ESOPHAGOGASTRODUODENOSCOPY (EGD);  Surgeon: Rachael Fee, MD;  Location: [facility];  Service: Endoscopy;  Laterality: N/A;    FAMILY HISTORY:  Family History  Problem Relation Age of Onset  . Heart disease Father 21    CAD  . Heart disease Mother   . Colon cancer Neg Hx     SOCIAL HISTORY:  History  Substance Use Topics  . Smoking status: Former Research scientist (life sciences)  . Smokeless tobacco: Never Used  . Alcohol Use: No    ALLERGIES: Aricept; Atorvastatin; Crestor; and Meloxicam  MEDICATIONS:  Current Outpatient Prescriptions  Medication Sig Dispense Refill  .  benzonatate (TESSALON) 100 MG capsule Take 1-2 capsules (100-200 mg total) by mouth 2 (two) times daily as needed for cough.  90 capsule  2  . Cholecalciferol (EQL VITAMIN D3) 1000 UNITS tablet Take 1,000 Units by mouth daily.        . clidinium-chlordiazePOXIDE (LIBRAX) 5-2.5 MG per capsule Take 1 capsule by mouth 3 (three) times daily before meals. Spasms in throat, chest  90 capsule  3  . diltiazem 2 % GEL Apply 1 application topically 2 (two) times daily.  30 g  11  . esomeprazole (NEXIUM) 40 MG capsule Take 1 capsule (40 mg total) by mouth 2 (two) times daily before a meal.  60 capsule  11  . ketoconazole (NIZORAL) 2 % cream APPLY TOPICALLY 2 (TWO) TIMES DAILY.  120 g  3  . Linaclotide (LINZESS) 145 MCG CAPS capsule Take 1 capsule (145 mcg total) by mouth daily as needed.  30 capsule  11  . multivitamin-lutein (OCUVITE-LUTEIN) CAPS Take 1 capsule by mouth daily.        Marland Kitchen senna-docusate (STOOL SOFTENER PLUS LAXATIVE) 8.6-50 MG per tablet Take 2 tablets by mouth 2 (two) times daily.        . sucralfate (CARAFATE) 1 GM/10ML suspension Take 1 gram with meals 3 times daily.  420 mL  0  . traMADol (ULTRAM) 50 MG tablet Take 1-2 tablets (50-100 mg total) by mouth 2 (two) times daily as needed.  90 tablet  2  . triamcinolone cream (KENALOG) 0.1 % APPLY TOPICALLY 3 (THREE) TIMES DAILY.  120 g  3   No current facility-administered medications for this encounter.    REVIEW OF SYSTEMS:  A 15 point review of systems is documented in the electronic medical record. This was obtained by the nursing staff. However, I reviewed this with the patient to discuss relevant findings and make appropriate changes.  Pertinent items are noted in HPI.  PHYSICAL EXAM: There were no vitals filed for this visit.. . Pleasant male in no distress sitting comfortably on examining table. He is cachectic. He has no palpable cervical or subclavicular adenopathy.  LABORATORY DATA:  Lab Results  Component Value Date   WBC 10.0  10/29/2013   HGB 10.8* 10/29/2013   HCT 32.7* 10/29/2013   MCV 85.6 10/29/2013   PLT 415.0* 10/29/2013   Lab Results  Component Value Date   NA 135 10/29/2013   K 3.8 10/29/2013   CL 99 10/29/2013   CO2 30 10/29/2013   Lab Results  Component Value Date   ALT 14 10/29/2013   AST 16 10/29/2013   ALKPHOS 50 10/29/2013   BILITOT 0.9 10/29/2013     RADIOGRAPHY: Ct Chest W Contrast  11/03/2013   CLINICAL DATA:  New diagnosis of mid esophageal carcinoma.  EXAM: CT CHEST, ABDOMEN, AND PELVIS WITH CONTRAST  TECHNIQUE: Multidetector CT imaging of the chest, abdomen and pelvis was performed following the standard protocol during bolus administration of intravenous contrast.  CONTRAST:  121mL OMNIPAQUE IOHEXOL 300 MG/ML  SOLN  COMPARISON:  CT ANGIO CHEST W/CM &/OR WO/CM dated 08/24/2010  FINDINGS: CT CHEST FINDINGS  A mid esophageal mass measures 5.2 x 5.9 x 9.2 cm. It is seen posterior to the mid/lower trachea and and exerts anterior mass effect on the airway. Trachea remains patent. Proximal esophagus is dilated as there is marked luminal narrowing at the level of the mass. Contrast does pass through into the upper gastrointestinal tract.  Mediastinal adenopathy measures up to 1.8 x 2.4 cm in the high right paratracheal station. AP window adenopathy measures 1.7 cm. Subcarinal lymph node measures 1.8 cm. No hilar or axillary adenopathy.  Heart is at the upper limits of normal in size. Three-vessel coronary artery calcification. No pericardial effusion. Small hiatal hernia.  Tiny bilateral pleural effusions, right greater than left. Image quality is severely degraded by respiratory motion upon review of the lung windows. There are scattered pulmonary nodules, some of which were seen on the prior exam. Others are new (example right lower lobe, 11 mm, series 4, image 36). Paraseptal emphysema. A focal area of spiculation in the apical left upper lobe (series 4, image 7) is unchanged from 08/24/2010 and indicative of scarring.  CT  ABDOMEN AND PELVIS FINDINGS  Image quality is degraded by respiratory motion. Low-attenuation lesions in the liver measure up to 2.8 cm. While the largest lesion appears well circumscribed and fluid in density, suggesting a cyst, others are too small to characterize. Liver, gallbladder and adrenal glands are otherwise unremarkable. Low-attenuation lesions are seen in the renal sinuses bilaterally with the largest parenchymal lesion measuring 1.4 cm in the upper pole left kidney. Statistically, cysts are most likely. Spleen, pancreas, stomach and small bowel are otherwise unremarkable. Masslike density in the proximal descending colon (Series 2, image 50).  Prostate measures 5.1 cm and indents the bladder base. Small bilateral inguinal hernias contain fat. Atherosclerotic calcification of the arterial vasculature without abdominal aortic aneurysm. A somewhat ill-defined area of low attenuation in the left periaortic station measures 1.4 x 2.0 cm (series 2, image 53). Otherwise, no pathologically enlarged lymph nodes. No free fluid. No worrisome lytic or sclerotic lesions. Degenerative changes are seen in the spine.  IMPRESSION: 1. Large, partially obstructing mid esophageal mass with metastatic mediastinal adenopathy and bilateral pulmonary nodules. 2. Somewhat ill-defined fluid density lesion in the suprarenal left periaortic station is nonspecific. Difficult to exclude adenopathy. Continued attention on followup exams is warranted. 3. Tiny bilateral effusions, right greater than left. 4. Masslike density in the proximal descending colon, possibly due to stool. Difficult to exclude a mass. Please correlate clinically. 5. Scattered low attenuation lesions in the liver, the largest of which is suggestive of a cyst. Others are too small to characterize. 6. Enlarged prostate.   Electronically Signed   By: Leanna Battles M.D.   On: 11/03/2013 10:05   Ct Abdomen Pelvis W Contrast  11/03/2013   CLINICAL DATA:  New  diagnosis of mid esophageal carcinoma.  EXAM: CT CHEST, ABDOMEN, AND PELVIS WITH CONTRAST  TECHNIQUE: Multidetector CT imaging of the chest, abdomen and pelvis was performed following the standard protocol during bolus administration of intravenous contrast.  CONTRAST:  OMNIPAQUE IOHEXOL 300 MG/ML  SOLN  COMPARISON:  CT ANGIO CHEST W/CM &/OR WO/CM dated 08/24/2010  FINDINGS: CT CHEST FINDINGS  A mid esophageal mass measures 5.2 x 5.9 x 9.2 cm. It is seen posterior to the mid/lower trachea and and exerts anterior mass effect on the airway. Trachea remains patent. Proximal esophagus is dilated as there  is marked luminal narrowing at the level of the mass. Contrast does pass through into the upper gastrointestinal tract.  Mediastinal adenopathy measures up to 1.8 x 2.4 cm in the high right paratracheal station. AP window adenopathy measures 1.7 cm. Subcarinal lymph node measures 1.8 cm. No hilar or axillary adenopathy.  Heart is at the upper limits of normal in size. Three-vessel coronary artery calcification. No pericardial effusion. Small hiatal hernia.  Tiny bilateral pleural effusions, right greater than left. Image quality is severely degraded by respiratory motion upon review of the lung windows. There are scattered pulmonary nodules, some of which were seen on the prior exam. Others are new (example right lower lobe, 11 mm, series 4, image 36). Paraseptal emphysema. A focal area of spiculation in the apical left upper lobe (series 4, image 7) is unchanged from 08/24/2010 and indicative of scarring.  CT ABDOMEN AND PELVIS FINDINGS  Image quality is degraded by respiratory motion. Low-attenuation lesions in the liver measure up to 2.8 cm. While the largest lesion appears well circumscribed and fluid in density, suggesting a cyst, others are too small to characterize. Liver, gallbladder and adrenal glands are otherwise unremarkable. Low-attenuation lesions are seen in the renal sinuses bilaterally with the  largest parenchymal lesion measuring 1.4 cm in the upper pole left kidney. Statistically, cysts are most likely. Spleen, pancreas, stomach and small bowel are otherwise unremarkable. Masslike density in the proximal descending colon (Series 2, image 50).  Prostate measures 5.1 cm and indents the bladder base. Small bilateral inguinal hernias contain fat. Atherosclerotic calcification of the arterial vasculature without abdominal aortic aneurysm. A somewhat ill-defined area of low attenuation in the left periaortic station measures 1.4 x 2.0 cm (series 2, image 53). Otherwise, no pathologically enlarged lymph nodes. No free fluid. No worrisome lytic or sclerotic lesions. Degenerative changes are seen in the spine.  IMPRESSION: 1. Large, partially obstructing mid esophageal mass with metastatic mediastinal adenopathy and bilateral pulmonary nodules. 2. Somewhat ill-defined fluid density lesion in the suprarenal left periaortic station is nonspecific. Difficult to exclude adenopathy. Continued attention on followup exams is warranted. 3. Tiny bilateral effusions, right greater than left. 4. Masslike density in the proximal descending colon, possibly due to stool. Difficult to exclude a mass. Please correlate clinically. 5. Scattered low attenuation lesions in the liver, the largest of which is suggestive of a cyst. Others are too small to characterize. 6. Enlarged prostate.   Electronically Signed   By: Lorin Picket M.D.   On: 11/03/2013 10:05      IMPRESSION: Likely stage IV metastatic esophageal cancer  PLAN: Dr. Benay Spice has ordered a PET scan for staging purposes. We will proceed on either with palliative chemoradiation or just palliative radiation to this esophageal mass based on the results of the PET scan. I encouraged him to consider his nutritional status and have planned on simulating him next week. I tried to get him to stay today to proceed on with simulation but he stated he was tired and would  return on another day. He has followup with medical oncology next week and I scheduled him for simulation on Tuesday the 14th at 3:00. I spent 60 minutes  face to face with the patient and more than 50% of that time was spent in counseling and/or coordination of care.   ------------------------------------------------  Thea Silversmith, MD

## 2013-11-11 ENCOUNTER — Ambulatory Visit
Admission: RE | Admit: 2013-11-11 | Discharge: 2013-11-11 | Disposition: A | Discharge status: Home / Self Care | Payer: PRIVATE HEALTH INSURANCE | Source: Ambulatory Visit | Attending: Radiation Oncology | Admitting: Radiation Oncology

## 2013-11-11 ENCOUNTER — Telehealth: Payer: Self-pay | Admitting: *Deleted

## 2013-11-11 DIAGNOSIS — C159 Malignant neoplasm of esophagus, unspecified: Secondary | ICD-10-CM

## 2013-11-11 DIAGNOSIS — C155 Malignant neoplasm of lower third of esophagus: Secondary | ICD-10-CM

## 2013-11-11 NOTE — Telephone Encounter (Signed)
Spoke with patient's son and reviewed his father's schedule.  He knows to come in on 11/13/13.  Will review and explain all of his schedule and answer any questions in person with translator.  He verbalized understanding.

## 2013-11-11 NOTE — Progress Notes (Signed)
[facility] Radiation Oncology Simulation and Treatment Planning Note   Name: Nathaniel Harvey MRN: [id number]  Date: 11/11/2013  DOB: 1933-12-18  Status: outpatient    DIAGNOSIS: Esophageal Cancer    CONSENT VERIFIED: yes   SET UP AND IMMOBILIZATION: Patient is setup supine with arms in a wing board.   NARRATIVE: The patient was brought to the [facility].  Identity was confirmed.  All relevant records and images related to the planned course of therapy were reviewed.  Then, the patient was positioned in a stable reproducible clinical set-up for radiation therapy.  CT images were obtained.  Skin markings were placed.  The CT images were loaded into the planning software where the target and avoidance structures were contoured.  The radiation prescription was entered and confirmed.   TREATMENT PLANNING NOTE:  Treatment planning then occurred. I have requested 3D simulation with [facility] of the spinal cord, total lungs and gross tumor volume. I have also requested mlcs and an isodose plan.   Special treatment procedure will be performed as Neill A [id number] will be receiving concurrent chemotherapy.   I have ordered a consult with the dietician for monitoring.  I will also be verifying that weekly lab values are appropriate.

## 2013-11-13 ENCOUNTER — Other Ambulatory Visit: Payer: 59

## 2013-11-13 ENCOUNTER — Telehealth: Payer: Self-pay | Admitting: Oncology

## 2013-11-13 ENCOUNTER — Other Ambulatory Visit: Payer: 59 | Admitting: Radiation Oncology

## 2013-11-13 ENCOUNTER — Ambulatory Visit: Payer: PRIVATE HEALTH INSURANCE | Admitting: Nutrition

## 2013-11-13 ENCOUNTER — Ambulatory Visit (HOSPITAL_BASED_OUTPATIENT_CLINIC_OR_DEPARTMENT_OTHER): Payer: 59 | Admitting: Nurse Practitioner

## 2013-11-13 ENCOUNTER — Encounter: Payer: Self-pay | Admitting: *Deleted

## 2013-11-13 VITALS — BP 115/72 | HR 102 | Temp 97.3°F | Resp 18 | Ht 65.0 in | Wt 130.2 lb

## 2013-11-13 DIAGNOSIS — C154 Malignant neoplasm of middle third of esophagus: Secondary | ICD-10-CM

## 2013-11-13 DIAGNOSIS — C159 Malignant neoplasm of esophagus, unspecified: Secondary | ICD-10-CM

## 2013-11-13 DIAGNOSIS — R1310 Dysphagia, unspecified: Secondary | ICD-10-CM

## 2013-11-13 MED ORDER — HYDROMORPHONE HCL 2 MG PO TABS: 2.0000 mg | ORAL_TABLET | ORAL | Status: DC | PRN

## 2013-11-13 NOTE — Progress Notes (Signed)
I met with patient and son regarding tolerance of oral nutrition supplements.  Patient does not like any milk-based drinks.  Continues to have difficulty tolerating oral solids and some liquids.  Weight down slightly and was documented as 130 pounds April 16 from 131.9 pounds April 9.  Nutrition diagnosis: Unintended weight loss continues.  Intervention: Provided juice-based oral nutrition supplements, all varieties, and encouraged patient to try these when he gets home.  Educated patient and son on full liquid diet.  Patient was not open to blenderizing foods or adding liquids to foods that he typically eats.  He will eat yogurt.  He drinks coffee.  Son encouraged patient to try baby food.  Monitoring, evaluation, goals: Inadequate oral intake continues with documented weight loss.  Patient still unable to tolerate oral nutrition supplements.  Will work to increase foods patient can swallow.  Next visit: Will continue to work with patient as needed.

## 2013-11-13 NOTE — [facility] Oncology Navigator Note (Signed)
Met with patient and son during today's visit.  Reviewed schedule and answered questions.  He was seen by dietician and SW during today's visit.  Patient lives alone in [place].  His son is very supportive.  His son discussed having an aide come in to assist his father using medicaid benefit.  SW to look into this along with transportation assistance.  Will continue to follow as needed.

## 2013-11-13 NOTE — Telephone Encounter (Signed)
gv adn pritned appt sched and avs for pt fro April and May

## 2013-11-13 NOTE — Progress Notes (Signed)
[name]  Clinical Social Work was referred by patient navigator for assessment of psychosocial needs due to possible communication issues.  Clinical Social Worker met with pt and his son after pt's appointment to further assess needs. 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 Pt lives alone at [facility] through the [place]. CSW received permission from pt and his son to contact social worker at [employer] in order to better assist pt with his daily living needs. CSW will contact pt's son to further address these concerns and get specifics re. Transportation needs.  CSW will continue to follow pt and assist as appropriate.     Clinical Social Work interventions: Resource education Supportive listening   Loren Racer, LCSW Clinical Social Worker [facility] for [facility] Wednesday, Thursday and Friday Phone: [phone] Fax: [phone]

## 2013-11-13 NOTE — Progress Notes (Addendum)
  [facility] OFFICE PROGRESS NOTE   Diagnosis:  Esophagus cancer.  INTERVAL HISTORY:   He returns as scheduled. He is tolerating some liquids. He is having difficulty with milk-based nutritional supplements. He continues to have mid chest pain. The pain worsens after swallowing. He takes Ultram with partial relief. Bowels moving.  Objective:  Vital signs in last 24 hours:  Blood pressure 115/72, pulse 102, temperature 97.3 F (36.3 C), temperature source Oral, resp. rate 18, height $RemoveBe'5\' 5"'UyMSIBUNO$  (1.651 m), weight 130 lb 3.2 oz (59.058 kg).    HEENT: No thrush or ulcerations. Resp: Lungs clear. Cardio: Regular cardiac rhythm. GI: No hepatomegaly. Vascular: Trace lower leg edema bilaterally. Neuro: Alert and oriented.  Skin: No rash.    Lab Results:  Lab Results  Component Value Date   WBC 10.0 10/29/2013   HGB 10.8* 10/29/2013   HCT 32.7* 10/29/2013   MCV 85.6 10/29/2013   PLT 415.0* 10/29/2013   NEUTROABS 7.7 10/29/2013    Imaging:  No results found.  Medications: I have reviewed the patient's current medications.  Assessment/Plan: 1. Esophagus cancer status post upper endoscopy 10/28/2013 confirming an obstructing mid esophageal mass with a biopsy confirming a poorly differentiated high-grade malignancy. Staging CT scans 11/03/2013 revealed an esophagus mass, mediastinal adenopathy and suspicious pulmonary nodules. 2. Solid/liquid dysphagia and odynophagia secondary to #1. 3. Weight loss. Weight is stable. 4. Possible descending colon mass on CT 11/03/2013.  Disposition: Nathaniel Harvey has a poor performance status. He may have metastatic disease. He is scheduled for a staging PET scan 11/14/2013. He has seen Dr. Pablo Harvey and is scheduled to begin palliative radiation on 11/17/2013.  He is tolerating some liquids. His weight is stable. He met with the Nathaniel Harvey dietitian again today.  He was provided with a prescription for Dilaudid 2 mg every 4 hours as needed  for pain. He understands to contact the office if this is not effective.  We will schedule a followup visit in approximately 2 weeks. He or his son will contact the office in the interim with any problems.   Patient seen with Dr. Benay Harvey.    Nathaniel Harvey ANP/GNP-BC   11/13/2013  4:03 PM  This was a shared visit with Nathaniel Harvey. He appears to have an improved performance status today compared to when we saw him 11/06/2013. The plan is to proceed with palliative radiation. We will then consider systemic therapy options.Nathaniel Harvey and his son appear to understand the treatment plan.  Nathaniel Harvey, M.D.

## 2013-11-14 ENCOUNTER — Encounter (HOSPITAL_COMMUNITY): Payer: Self-pay

## 2013-11-14 ENCOUNTER — Ambulatory Visit (HOSPITAL_COMMUNITY)
Admission: RE | Admit: 2013-11-14 | Discharge: 2013-11-14 | Disposition: A | Discharge status: Home / Self Care | Payer: PRIVATE HEALTH INSURANCE | Source: Ambulatory Visit | Attending: Oncology | Admitting: Oncology

## 2013-11-14 DIAGNOSIS — J90 Pleural effusion, not elsewhere classified: Secondary | ICD-10-CM | POA: Insufficient documentation

## 2013-11-14 DIAGNOSIS — C7952 Secondary malignant neoplasm of bone marrow: Secondary | ICD-10-CM

## 2013-11-14 DIAGNOSIS — C7951 Secondary malignant neoplasm of bone: Secondary | ICD-10-CM | POA: Insufficient documentation

## 2013-11-14 DIAGNOSIS — C159 Malignant neoplasm of esophagus, unspecified: Secondary | ICD-10-CM

## 2013-11-14 DIAGNOSIS — C779 Secondary and unspecified malignant neoplasm of lymph node, unspecified: Secondary | ICD-10-CM | POA: Insufficient documentation

## 2013-11-14 DIAGNOSIS — C7800 Secondary malignant neoplasm of unspecified lung: Secondary | ICD-10-CM | POA: Insufficient documentation

## 2013-11-14 LAB — GLUCOSE, CAPILLARY: Glucose-Capillary: 92 mg/dL (ref 70–99)

## 2013-11-14 MED ORDER — FLUDEOXYGLUCOSE F - 18 (FDG) INJECTION
6.7000 | Freq: Once | INTRAVENOUS | Status: AC | PRN
  Administered 2013-11-14: 6.7 via INTRAVENOUS

## 2013-11-17 ENCOUNTER — Ambulatory Visit
Admission: RE | Admit: 2013-11-17 | Discharge: 2013-11-17 | Disposition: A | Discharge status: Home / Self Care | Payer: PRIVATE HEALTH INSURANCE | Source: Ambulatory Visit | Attending: Radiation Oncology | Admitting: Radiation Oncology

## 2013-11-17 DIAGNOSIS — C155 Malignant neoplasm of lower third of esophagus: Secondary | ICD-10-CM

## 2013-11-18 ENCOUNTER — Ambulatory Visit
Admission: RE | Admit: 2013-11-18 | Discharge: 2013-11-18 | Disposition: A | Discharge status: Home / Self Care | Payer: PRIVATE HEALTH INSURANCE | Source: Ambulatory Visit | Attending: Radiation Oncology | Admitting: Radiation Oncology

## 2013-11-18 ENCOUNTER — Ambulatory Visit: Payer: PRIVATE HEALTH INSURANCE | Admitting: Radiation Oncology

## 2013-11-18 ENCOUNTER — Encounter: Payer: Self-pay | Admitting: *Deleted

## 2013-11-18 NOTE — Progress Notes (Signed)
Clinical Social Worker contacted pt's son to offer support and follow up regarding transportation and home care needs.  Pt's son stated that since pt's schedule had been changed he would now be able to provide transportation to his fathers appointments.  Pt's son also stated he had given forms to his PCP requesting home care assistance.  CSW contact pt's PCP [facility], Dr. Alain Marion) to confirm.  CSW spoke with [place], who confirmed that paperwork had been received and they were in the process of being completed.  CSW will follow up with pt and pt's some with updated information.    Nathaniel Harvey, MSW, [place] Worker [facility] [phone]

## 2013-11-18 NOTE — Progress Notes (Signed)
  Radiation Oncology         [phone] ________________________________  Name: Nathaniel Harvey MRN: [id number]  Date: 11/17/2013  DOB: 1934-01-05  Simulation Verification Note  Status: outpatient  NARRATIVE: The patient was brought to the treatment unit and placed in the planned treatment position. The clinical setup was verified. Then port films were obtained and uploaded to the radiation oncology medical record software.  The treatment beams were carefully compared against the planned radiation fields. The position location and shape of the radiation fields was reviewed. The targeted volume of tissue appears appropriately covered by the radiation beams. Organs at risk appear to be excluded as planned.  Based on my personal review, I approved the simulation verification. The patient's treatment will proceed as planned.  ------------------------------------------------  Thea Silversmith, MD

## 2013-11-19 ENCOUNTER — Ambulatory Visit
Admission: RE | Admit: 2013-11-19 | Discharge: 2013-11-19 | Disposition: A | Discharge status: Home / Self Care | Payer: PRIVATE HEALTH INSURANCE | Source: Ambulatory Visit | Attending: Radiation Oncology | Admitting: Radiation Oncology

## 2013-11-20 ENCOUNTER — Ambulatory Visit
Admission: RE | Admit: 2013-11-20 | Discharge: 2013-11-20 | Disposition: A | Discharge status: Home / Self Care | Payer: PRIVATE HEALTH INSURANCE | Source: Ambulatory Visit | Attending: Radiation Oncology | Admitting: Radiation Oncology

## 2013-11-20 ENCOUNTER — Encounter: Payer: Self-pay | Admitting: Radiation Oncology

## 2013-11-20 VITALS — BP 99/69 | HR 121 | Temp 98.2°F | Ht 65.0 in

## 2013-11-20 DIAGNOSIS — C159 Malignant neoplasm of esophagus, unspecified: Secondary | ICD-10-CM

## 2013-11-20 MED ORDER — SUCRALFATE 1 G PO TABS: ORAL_TABLET | ORAL | Status: AC

## 2013-11-20 NOTE — Progress Notes (Signed)
Weekly Management Note Current Dose:10 Gy  Projected Dose:30 Gy   Narrative:  The patient presents for routine under treatment assessment.  CBCT/MVCT images/Port film x-rays were reviewed.  The chart was checked. Diarrhea due to liquid diet. No abdominal pain. Feeling "ok". Discussed results of PET with son.   Physical Findings:  Unchanged  Vitals: orthostatic vitals reviewed.  Filed Vitals:   11/20/13 1855  BP: 99/69  Pulse: 121  Temp:    Weight:  Wt Readings from Last 3 Encounters:  11/13/13 130 lb 3.2 oz (59.058 kg)  11/06/13 131 lb 14.4 oz (59.829 kg)  11/04/13 133 lb 12 oz (60.669 kg)   Lab Results  Component Value Date   WBC 10.0 10/29/2013   HGB 10.8* 10/29/2013   HCT 32.7* 10/29/2013   MCV 85.6 10/29/2013   PLT 415.0* 10/29/2013   Lab Results  Component Value Date   CREATININE 0.7 10/29/2013   BUN 8 10/29/2013   NA 135 10/29/2013   K 3.8 10/29/2013   CL 99 10/29/2013   CO2 30 10/29/2013     Impression:  The patient is tolerating radiation.  Plan:  Continue treatment as planned. Continue to push fluids and po intake. Follow up with med onc on Friday.

## 2013-11-20 NOTE — Progress Notes (Signed)
Nathaniel Harvey has received 4 fractions to his esophagus.  He C/O feeling very weak today.  Orthostatic pressures obtained and BP elevated slightly from sitting to standing ,but remains below 250 systolic.  His pulse is elevated from sitting 114 to 121 standing. Weight unknown since he refused to stand to weigh today.  He is only eating rice pudding with sour cream, his normal, in addition to mashed potatoes with milk.  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 has experienced 6-7 diarrheal stools today.  discouraged use of milk in foods currently.   He is also C/O burning in his esophagus and  Dr. Pablo Ledger ordered Carafate tabs to dilute in 15 ml of water.  Given medicine cups for measuring. Encouraged to take Carafate as ordered and not sporadically to get maximum relief. Will check status on tomorrow.

## 2013-11-21 ENCOUNTER — Ambulatory Visit: Payer: 59 | Admitting: Internal Medicine

## 2013-11-21 ENCOUNTER — Ambulatory Visit
Admission: RE | Admit: 2013-11-21 | Discharge: 2013-11-21 | Disposition: A | Discharge status: Home / Self Care | Payer: PRIVATE HEALTH INSURANCE | Source: Ambulatory Visit | Attending: Radiation Oncology | Admitting: Radiation Oncology

## 2013-11-24 ENCOUNTER — Ambulatory Visit
Admission: RE | Admit: 2013-11-24 | Discharge: 2013-11-24 | Disposition: A | Discharge status: Home / Self Care | Payer: PRIVATE HEALTH INSURANCE | Source: Ambulatory Visit | Attending: Radiation Oncology | Admitting: Radiation Oncology

## 2013-11-25 ENCOUNTER — Ambulatory Visit
Admission: RE | Admit: 2013-11-25 | Discharge: 2013-11-25 | Disposition: A | Discharge status: Home / Self Care | Payer: PRIVATE HEALTH INSURANCE | Source: Ambulatory Visit | Attending: Radiation Oncology | Admitting: Radiation Oncology

## 2013-11-25 ENCOUNTER — Encounter: Payer: Self-pay | Admitting: Radiation Oncology

## 2013-11-25 VITALS — BP 90/54 | HR 114 | Temp 97.3°F | Resp 20 | Wt 133.2 lb

## 2013-11-25 DIAGNOSIS — C159 Malignant neoplasm of esophagus, unspecified: Secondary | ICD-10-CM

## 2013-11-25 NOTE — Progress Notes (Signed)
Weekly Management Note Current Dose: 17.5 Gy  Projected Dose:30 Gy   Narrative:  The patient presents for routine under treatment assessment.  CBCT/MVCT images/Port film x-rays were reviewed.  The chart was checked. Swallowing better. Squeezing chest pain after he eats, radiating to back. Taking dilaudid at night only. Using carafate during the day. Son is concerned about "addiction." Son requests help with home health aid in house. Application faxed 2 weeks ago and no response.  Son also would like IV glucose or something to give him more energy. Daughter is here. Interpreter present for half of conversation.   Physical Findings:  Appears more alter and interactive. In a wheelchair.   Vitals:  Filed Vitals:   11/25/13 1154  BP: 90/54  Pulse: 114  Temp:   Resp: 20   Weight:  Wt Readings from Last 3 Encounters:  11/25/13 133 lb 3.2 oz (60.419 kg)  11/13/13 130 lb 3.2 oz (59.058 kg)  11/06/13 131 lb 14.4 oz (59.829 kg)   Lab Results  Component Value Date   WBC 10.0 10/29/2013   HGB 10.8* 10/29/2013   HCT 32.7* 10/29/2013   MCV 85.6 10/29/2013   PLT 415.0* 10/29/2013   Lab Results  Component Value Date   CREATININE 0.7 10/29/2013   BUN 8 10/29/2013   NA 135 10/29/2013   K 3.8 10/29/2013   CL 99 10/29/2013   CO2 30 10/29/2013     Impression:  The patient is tolerating radiation.  Plan:  Continue treatment as planned. Discussed upping use of pain medication. Discussed that his protein levels were the most important and glucose/water may give him temporary energy but more protein would help him heal faster and gain true energy. Will ask RN to help with home health aid. Seeing med onc on Friday. Will ask dietician and social worker to follow up.

## 2013-11-25 NOTE — Progress Notes (Signed)
Weekly rad tx, esohagus 7 tx completed, taking carafte "helps a little stated patient via interpreter , daughter requestuing EKG stated when patient gets home after rad txs, he has chest tightness, ,pataient stated after he eats a meal he has chest pain, /heartburen, takes nexium as well, took ortho viotals sitting b/p=88/66,pulse=106,rr=20,T=97.3 Standing b/p=90/54,pulse=114.rr=20, fatigued, eats rice pudding ,mashed potatioes with sour cream, bulioun, still having diarrhea, stop milk products,(sour cream too), needs to take imodium over the counter for the diarrhea,   no pain right now 11:53 AM

## 2013-11-26 ENCOUNTER — Ambulatory Visit
Admission: RE | Admit: 2013-11-26 | Discharge: 2013-11-26 | Disposition: A | Discharge status: Home / Self Care | Payer: PRIVATE HEALTH INSURANCE | Source: Ambulatory Visit | Attending: Radiation Oncology | Admitting: Radiation Oncology

## 2013-11-27 ENCOUNTER — Ambulatory Visit
Admission: RE | Admit: 2013-11-27 | Discharge: 2013-11-27 | Disposition: A | Discharge status: Home / Self Care | Payer: PRIVATE HEALTH INSURANCE | Source: Ambulatory Visit | Attending: Radiation Oncology | Admitting: Radiation Oncology

## 2013-11-27 NOTE — Progress Notes (Signed)
[place] [employer] and [employer] to verify receipt of application.Spoke with Danae Chen and she stated paperwork received on 11/26/13 and that it will take about 14 business days to process.

## 2013-11-28 ENCOUNTER — Ambulatory Visit
Admission: RE | Admit: 2013-11-28 | Discharge: 2013-11-28 | Disposition: A | Discharge status: Home / Self Care | Payer: PRIVATE HEALTH INSURANCE | Source: Ambulatory Visit | Attending: Radiation Oncology | Admitting: Radiation Oncology

## 2013-11-28 ENCOUNTER — Ambulatory Visit (HOSPITAL_BASED_OUTPATIENT_CLINIC_OR_DEPARTMENT_OTHER): Payer: 59 | Admitting: Nurse Practitioner

## 2013-11-28 ENCOUNTER — Encounter: Payer: Self-pay | Admitting: *Deleted

## 2013-11-28 ENCOUNTER — Telehealth: Payer: Self-pay | Admitting: Oncology

## 2013-11-28 ENCOUNTER — Ambulatory Visit (HOSPITAL_COMMUNITY)
Admission: RE | Admit: 2013-11-28 | Discharge: 2013-11-28 | Disposition: A | Discharge status: Home / Self Care | Payer: PRIVATE HEALTH INSURANCE | Source: Ambulatory Visit | Attending: Nurse Practitioner | Admitting: Nurse Practitioner

## 2013-11-28 VITALS — BP 101/65 | HR 128 | Temp 97.9°F | Resp 20 | Ht 65.0 in | Wt 132.1 lb

## 2013-11-28 DIAGNOSIS — C7952 Secondary malignant neoplasm of bone marrow: Secondary | ICD-10-CM

## 2013-11-28 DIAGNOSIS — C159 Malignant neoplasm of esophagus, unspecified: Secondary | ICD-10-CM

## 2013-11-28 DIAGNOSIS — R1310 Dysphagia, unspecified: Secondary | ICD-10-CM

## 2013-11-28 DIAGNOSIS — R059 Cough, unspecified: Secondary | ICD-10-CM | POA: Insufficient documentation

## 2013-11-28 DIAGNOSIS — D490 Neoplasm of unspecified behavior of digestive system: Secondary | ICD-10-CM

## 2013-11-28 DIAGNOSIS — R634 Abnormal weight loss: Secondary | ICD-10-CM

## 2013-11-28 DIAGNOSIS — C154 Malignant neoplasm of middle third of esophagus: Secondary | ICD-10-CM

## 2013-11-28 DIAGNOSIS — J90 Pleural effusion, not elsewhere classified: Secondary | ICD-10-CM | POA: Insufficient documentation

## 2013-11-28 DIAGNOSIS — R05 Cough: Secondary | ICD-10-CM

## 2013-11-28 DIAGNOSIS — R0989 Other specified symptoms and signs involving the circulatory and respiratory systems: Secondary | ICD-10-CM | POA: Insufficient documentation

## 2013-11-28 DIAGNOSIS — C778 Secondary and unspecified malignant neoplasm of lymph nodes of multiple regions: Secondary | ICD-10-CM

## 2013-11-28 DIAGNOSIS — R0602 Shortness of breath: Secondary | ICD-10-CM

## 2013-11-28 DIAGNOSIS — R0609 Other forms of dyspnea: Secondary | ICD-10-CM | POA: Insufficient documentation

## 2013-11-28 DIAGNOSIS — C7951 Secondary malignant neoplasm of bone: Secondary | ICD-10-CM

## 2013-11-28 DIAGNOSIS — Z8501 Personal history of malignant neoplasm of esophagus: Secondary | ICD-10-CM | POA: Insufficient documentation

## 2013-11-28 MED ORDER — HYDROMORPHONE HCL 2 MG PO TABS: 2.0000 mg | ORAL_TABLET | ORAL | Status: AC | PRN

## 2013-11-28 NOTE — Progress Notes (Signed)
Faxed Hospice Referral to [facility].  Per Dr. Benay Spice.

## 2013-11-28 NOTE — Progress Notes (Signed)
[place] Work  Clinical Social Work continues to follow and followed up on application for CNA through [employer]. CSW confirmed type of resource applied for through medicaid, there are several and CSW was unsure which had been started. Pt has applied for personal care services (PCS) through [facility] not CAP services and he should find out in about two weeks if he is approved. Most likely he will be approved for these resources.  CSW will continue to follow as other needs may arise.   Clinical Social Work interventions: Resource coordination  Loren Racer, [place] Social Worker [facility] [place] for [place] Wednesday, Thursday and Friday Phone: [phone] Fax: [phone]

## 2013-11-28 NOTE — Telephone Encounter (Signed)
gave pt appt for Md only sent pt to radiology

## 2013-11-28 NOTE — Progress Notes (Addendum)
[facility] OFFICE PROGRESS NOTE   Diagnosis:  Esophagus cancer.  INTERVAL HISTORY:   Mr. Court returns as scheduled. He continues radiation. He is swallowing better, now able to tolerate some soft foods. He continues to have pain with swallowing. He is taking Dilaudid 3-4 times a day. Energy level is poor. He denies diarrhea but it is having multiple formed stools a day. He complaints of shortness of breath and a cough.  Objective:  Vital signs in last 24 hours:  Blood pressure 101/65, pulse 128, temperature 97.9 F (36.6 C), temperature source Oral, resp. rate 20, height 5\' 5"  (1.651 m), weight 132 lb 1.6 oz (59.92 kg), SpO2 97.00%.  Repeat heart rate 110.  HEENT: No thrush or ulcerations. Lymphatics: No palpable supraclavicular lymph nodes. Resp: Breath sounds diminished right lower lung field. Increased respiratory rate. No respiratory distress. Cardio: Irregular. GI: No hepatomegaly. Vascular: Question trace lower leg edema bilaterally.  MSK: Marked kyphosis.    Lab Results:  Lab Results  Component Value Date   WBC 10.0 10/29/2013   HGB 10.8* 10/29/2013   HCT 32.7* 10/29/2013   MCV 85.6 10/29/2013   PLT 415.0* 10/29/2013   NEUTROABS 7.7 10/29/2013    Imaging:  No results found.  Medications: I have reviewed the patient's current medications.  Assessment/Plan: 1. Esophagus cancer status post upper endoscopy 10/28/2013 confirming an obstructing mid esophageal mass with a biopsy confirming a poorly differentiated high-grade malignancy.   Staging CT scans 11/03/2013 revealed an esophagus mass, mediastinal adenopathy and suspicious pulmonary nodules.   Staging PET scan 11/14/2013 showed a large circumferential mass involving the mid thoracic esophagus with intense radiotracer uptake; hypermetabolic lymph node metastasis involving the mediastinum, right supraclavicular region and right retropectoral region; multi-focal hypermetabolic bone metastasis involving  the axial skeleton; left upper quadrant of the abdomen with a nonobstructing mass associated with the splenic flexure measuring 6.3 x 4.7 cm, SUV max equal to 39 and a second area of less specific uptake noted within the sigmoid colon.   Initiation of radiation 11/17/2013.  2. Solid/liquid dysphagia and odynophagia secondary to #1. 3. Weight loss. Weight is stable. 4. Possible descending colon mass on CT 11/03/2013. PET scan 11/14/2013 with a hypermetabolic splenic flexure mass and a second area of less specific uptake within the sigmoid colon.   Disposition: Mr. Dineen has a poor performance status. He will complete the course of palliative radiation early next week. He does not appear to be a candidate for systemic therapy based on his performance status.  Dr. Benay Spice reviewed the PET scan result with Mr. Costanzo and his son. They understand he has metastatic disease and that he may also have a primary colon cancer.  Dr. Benay Spice discussed a supportive care approach versus chemotherapy if his performance status improves. Mr. Hofmann elects supportive care with a hospice referral.   He lives alone. We discussed [place] with him and his son. He would like to remain at his current residence as long as possible. We will ask hospice to arrange for a visit this weekend.  Dr. Benay Spice initiated discussion regarding CODE STATUS/end-of-life issues. Mr. Haran is not ready to make a decision regarding CODE STATUS. We will ask the hospice team to discuss further with him.  On exam he has diminished breath sounds on the right. We will obtain a chest x-ray today. If he is found to have a significant right pleural effusion we will arrange for a thoracentesis on 12/01/2013.  He will return for a followup  visit in 2 weeks. He or his son will contact the office in the interim with any problems.   Patient seen with Dr. Benay Spice. 25 minutes were spent face-to-face at today's visit with the majority of  that time involved in counseling/coordination of care.   Owens Shark ANP/GNP-BC   11/28/2013  4:09 PM  So they shared visit with Ned Card.  UX.NATFTDD is completing palliative radiation to the esophagus. The staging PET scan reveals evidence of widespread metastatic disease and a probable colon cancer primary. I discussed the PET scan findings and treatment options with Mr. Geisen and his son. He has a poor performance status. He agrees with a [facility] referral. He appears to be a candidate for  [employer].  We will refer him for a chest x-ray to look for evidence of an enlarging right pleural effusion.  Julieanne Manson, M.D.

## 2013-12-01 ENCOUNTER — Ambulatory Visit
Admission: RE | Admit: 2013-12-01 | Discharge: 2013-12-01 | Disposition: A | Discharge status: Home / Self Care | Payer: PRIVATE HEALTH INSURANCE | Source: Ambulatory Visit | Attending: Radiation Oncology | Admitting: Radiation Oncology

## 2013-12-01 ENCOUNTER — Telehealth: Payer: Self-pay | Admitting: *Deleted

## 2013-12-01 NOTE — Telephone Encounter (Signed)
Santiago Glad RN--Hospice called to update Dr. Benay Spice of pt status;  Reports pt SOB at rest RR-36-40 HR 125 BP 102/64 Pulse Ox 85% lungs wheezes Left side, right side diminished. Pt refused oxygen or admission.  Santiago Glad states Pt indicates he wants to be DNR but children want everything done.  Asking advice from office due to language barrier.  Note to Dr. Benay Spice.

## 2013-12-02 ENCOUNTER — Ambulatory Visit: Payer: PRIVATE HEALTH INSURANCE

## 2013-12-02 ENCOUNTER — Ambulatory Visit: Payer: PRIVATE HEALTH INSURANCE | Admitting: Radiation Oncology

## 2013-12-03 ENCOUNTER — Telehealth: Payer: Self-pay | Admitting: *Deleted

## 2013-12-03 ENCOUNTER — Encounter: Payer: Self-pay | Admitting: Radiation Oncology

## 2013-12-03 ENCOUNTER — Ambulatory Visit: Payer: PRIVATE HEALTH INSURANCE

## 2013-12-03 NOTE — Telephone Encounter (Signed)
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 Confirmed with Hospice today that he died on 2013/12/14 at 64. CPR had been initiated at family request. Notified radiation oncology department of death.

## 2013-12-03 NOTE — Progress Notes (Signed)
  Radiation Oncology         [phone] ________________________________  Name: Nathaniel Harvey MRN: [id number]  Date: 12/03/2013  DOB: [birth year]  End of Treatment Note  Diagnosis:   Metastatic esophageal carcinoma (Stage IV)  Indication for treatment:  Palliative       Radiation treatment dates:   11/17/13-12/01/13  Site/dose:   Esophagus/ 27.5 gy at 2.5 Gy per fraction x 11 fractions  Beams/energy:   AP/PA with 15 MV photons.   Narrative: Nathaniel Harvey struggled with nutrition during treatment although he maintained his weight and was actually swallowing better towards the end of treatment. He unfortunately passed away during treatment and was not able to finish his prescribed course of treatment.   Plan: The patient has completed radiation treatment. The patient will return to radiation oncology clinic for routine followup in one month. I advised them to call or return sooner if they have any questions or concerns related to their recovery or treatment.  ------------------------------------------------  Thea Silversmith, MD

## 2013-12-04 ENCOUNTER — Ambulatory Visit: Payer: PRIVATE HEALTH INSURANCE

## 2013-12-05 ENCOUNTER — Ambulatory Visit: Payer: PRIVATE HEALTH INSURANCE

## 2013-12-08 ENCOUNTER — Ambulatory Visit: Payer: PRIVATE HEALTH INSURANCE

## 2013-12-09 ENCOUNTER — Ambulatory Visit: Payer: PRIVATE HEALTH INSURANCE

## 2013-12-10 ENCOUNTER — Ambulatory Visit: Payer: PRIVATE HEALTH INSURANCE

## 2013-12-11 ENCOUNTER — Ambulatory Visit: Payer: PRIVATE HEALTH INSURANCE

## 2013-12-12 ENCOUNTER — Ambulatory Visit: Payer: 59 | Admitting: Oncology

## 2013-12-12 ENCOUNTER — Ambulatory Visit: Payer: PRIVATE HEALTH INSURANCE

## 2013-12-15 ENCOUNTER — Ambulatory Visit: Payer: PRIVATE HEALTH INSURANCE

## 2013-12-16 ENCOUNTER — Ambulatory Visit: Payer: PRIVATE HEALTH INSURANCE

## 2013-12-17 ENCOUNTER — Other Ambulatory Visit: Payer: Self-pay

## 2013-12-17 ENCOUNTER — Ambulatory Visit: Payer: PRIVATE HEALTH INSURANCE

## 2013-12-18 ENCOUNTER — Ambulatory Visit: Payer: PRIVATE HEALTH INSURANCE

## 2013-12-18 ENCOUNTER — Encounter: Payer: 59 | Admitting: Nutrition

## 2013-12-19 ENCOUNTER — Ambulatory Visit: Payer: PRIVATE HEALTH INSURANCE

## 2013-12-23 ENCOUNTER — Ambulatory Visit: Payer: PRIVATE HEALTH INSURANCE

## 2013-12-24 ENCOUNTER — Ambulatory Visit: Payer: PRIVATE HEALTH INSURANCE

## 2013-12-25 ENCOUNTER — Ambulatory Visit: Payer: PRIVATE HEALTH INSURANCE

## 2013-12-29 DEATH — deceased

## 2014-11-06 IMAGING — CR DG CERVICAL SPINE COMPLETE 4+V
5 series · 5 of 5 positions shown · non-contrast
Comparison: None.

CLINICAL DATA: Left neck and shoulder pain for 3 weeks, recent
lifting injury, initial encounter.

CERVICAL SPINE - COMPLETE 4+ VIEW

[view not recorded (1 of 5)]
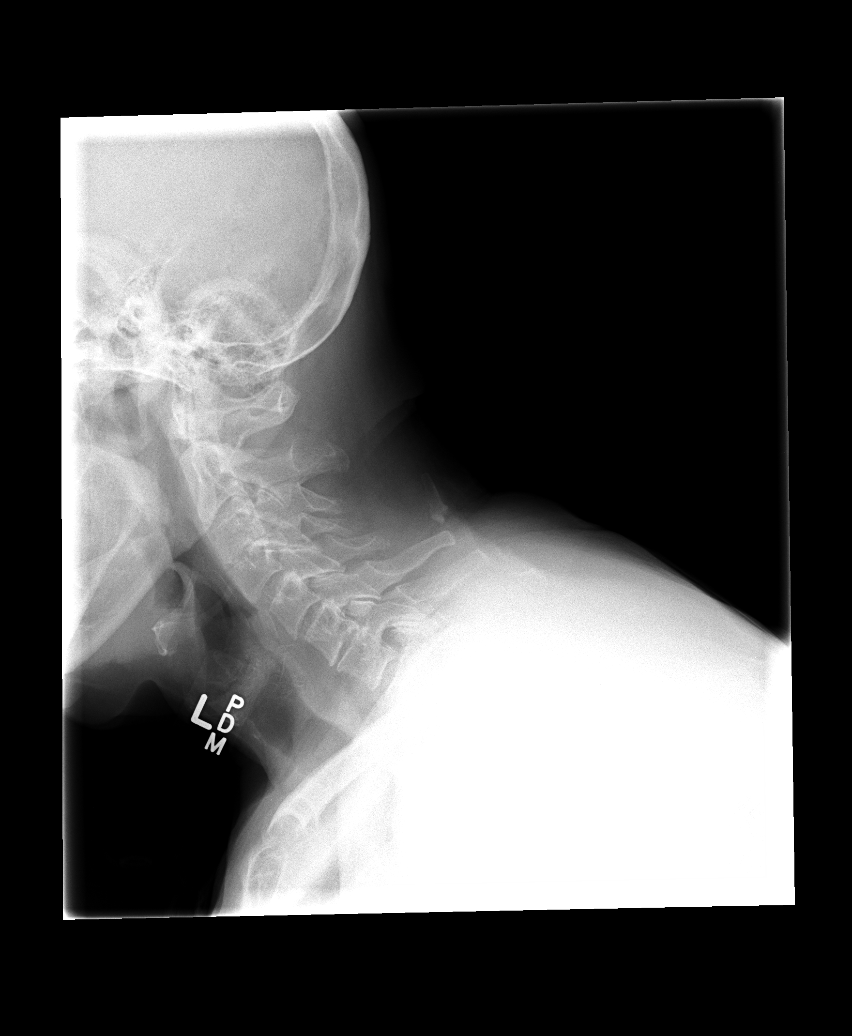

[view not recorded (2 of 5)]
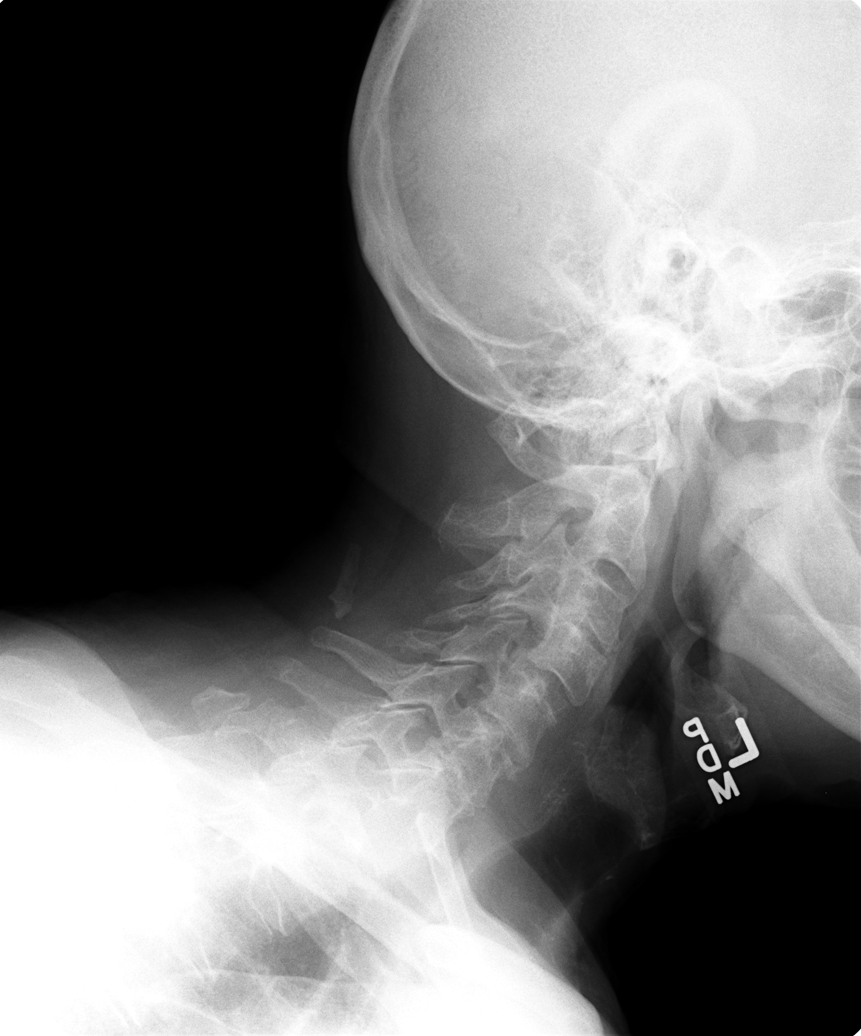

[view not recorded (3 of 5)]
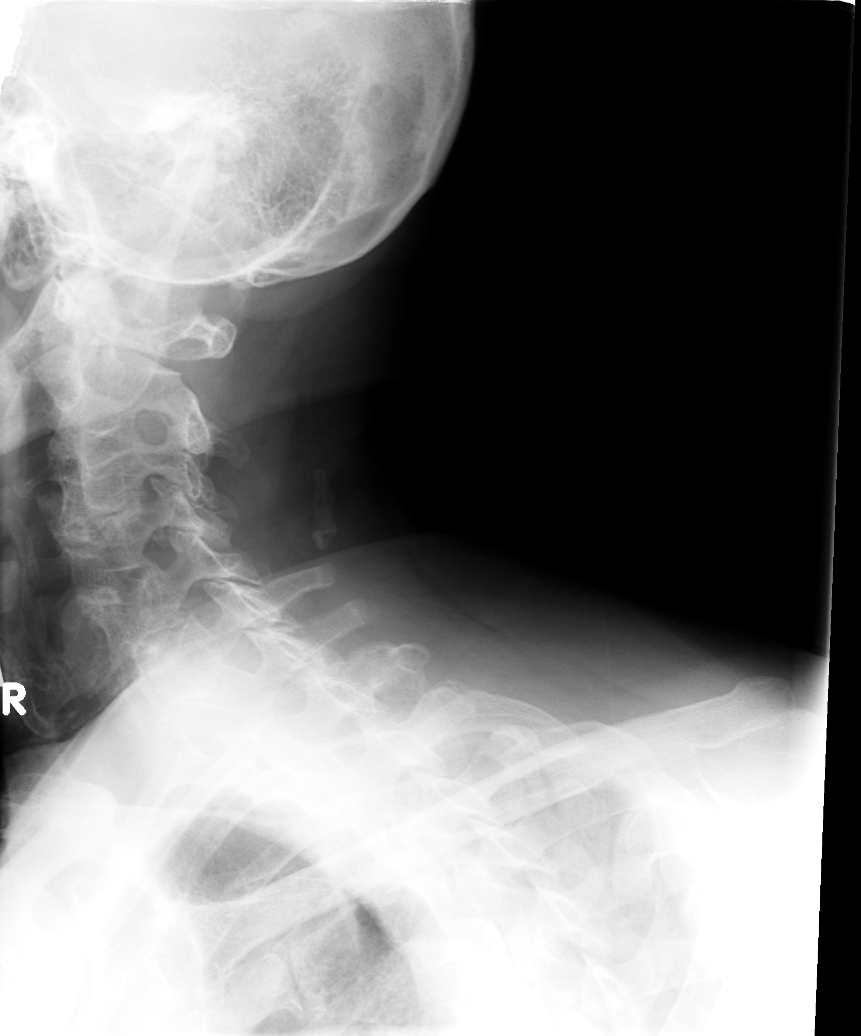

[view not recorded (4 of 5)]
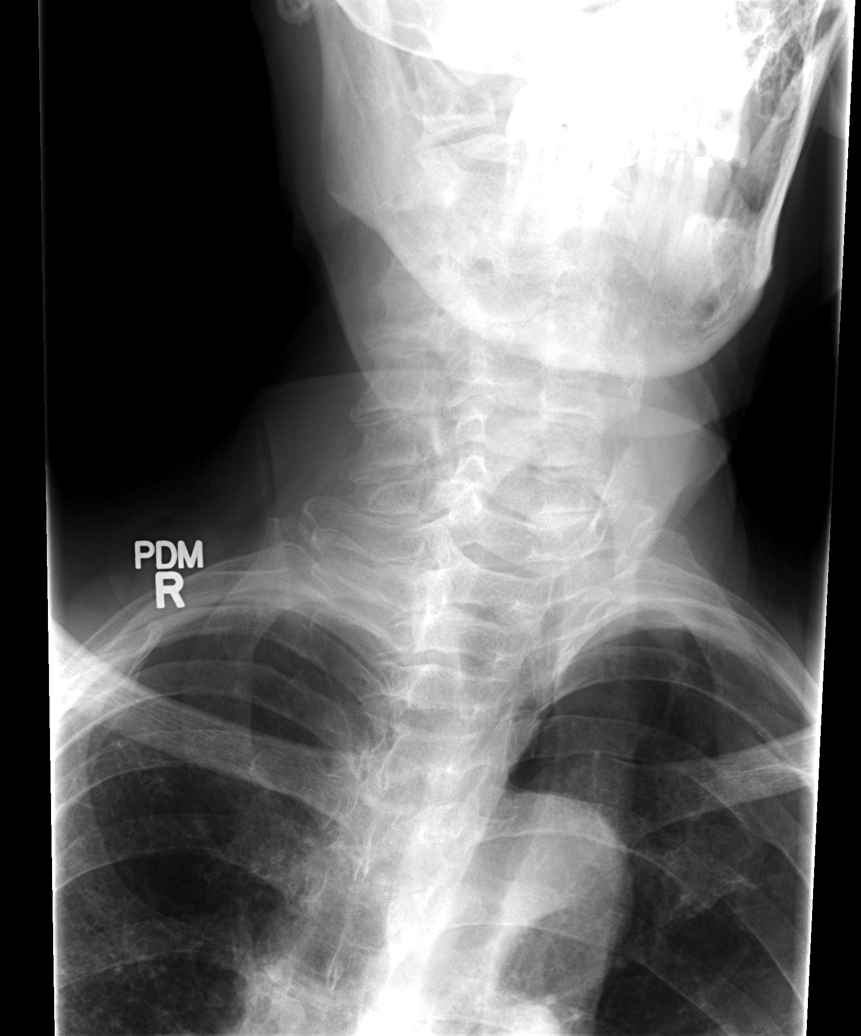

[view not recorded (5 of 5)]
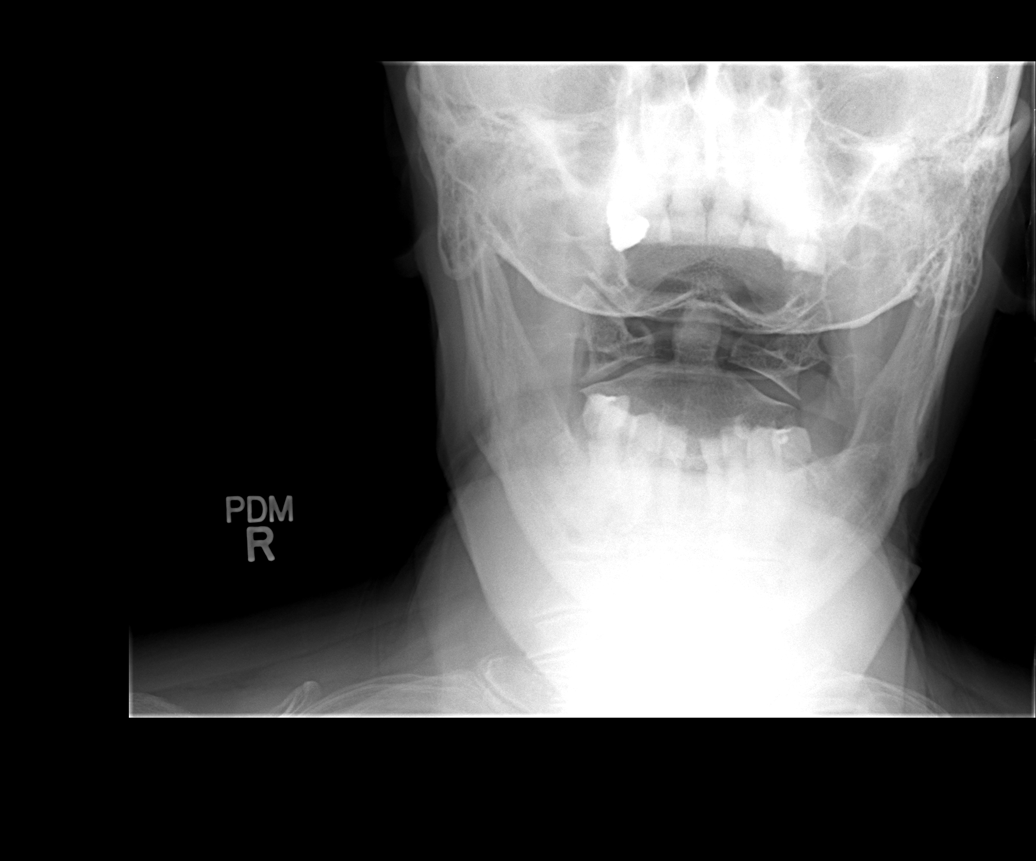

[5 of 5 positions shown; findings below may reference images not displayed]

FINDINGS: C1 to the inferior endplate of C7 is visualized on the provided
lateral radiograph.

The lateral radiograph is degraded due to patient positioning.  The
provided PA radiograph is degraded due to overlying chin.

Mildly accentuated cervical lordosis.  No definite anterolisthesis
or retrolisthesis.

The dens is normally positioned between the lateral masses of C1.
The bilateral facets appear normally aligned given obliquity.

The cervical vertebral body heights are preserved.  Prevertebral
soft tissues are normal.

There is mild to moderate multilevel cervical spine DDD, likely
worse at C3 - C4 with disc space height loss, end plate
irregularity and sclerosis.

There is partial ossification of the nuchal ligament.  Regional
soft tissues appear normal.  Limited visualization of lung apices
is normal.
IMPRESSION: 1.  Degraded examination without definite acute findings.
2.  Mild to moderate multilevel cervical spine DDD, likely worse at
C3 - C4.  Further evaluation with cervical spine MRI may be
performed as clinically indicated.
